# Patient Record
Sex: Male | Born: 1935
Health system: Southern US, Community
[De-identification: ages and names within clinical notes are randomized; demographics above are authoritative.]

## PROBLEM LIST (undated history)

## (undated) DIAGNOSIS — K219 Gastro-esophageal reflux disease without esophagitis: Secondary | ICD-10-CM

## (undated) DIAGNOSIS — F0390 Unspecified dementia without behavioral disturbance: Secondary | ICD-10-CM

## (undated) DIAGNOSIS — R32 Unspecified urinary incontinence: Secondary | ICD-10-CM

## (undated) DIAGNOSIS — C61 Malignant neoplasm of prostate: Secondary | ICD-10-CM

## (undated) DIAGNOSIS — I252 Old myocardial infarction: Secondary | ICD-10-CM

## (undated) HISTORY — PX: CORONARY ARTERY BYPASS GRAFT: SHX141

## (undated) HISTORY — DX: Malignant neoplasm of prostate: C61

## (undated) HISTORY — PX: ABDOMINAL AORTA STENT: SHX1108

## (undated) HISTORY — PX: HERNIA REPAIR: SHX51

## (undated) HISTORY — DX: Gastro-esophageal reflux disease without esophagitis: K21.9

## (undated) HISTORY — PX: PROSTATE SURGERY: SHX751

## (undated) HISTORY — DX: Old myocardial infarction: I25.2

## (undated) HISTORY — DX: Unspecified urinary incontinence: R32

## (undated) HISTORY — DX: Unspecified dementia, unspecified severity, without behavioral disturbance, psychotic disturbance, mood disturbance, and anxiety: F03.90

---

## 2013-11-08 DIAGNOSIS — A46 Erysipelas: Secondary | ICD-10-CM | POA: Diagnosis not present

## 2013-11-08 DIAGNOSIS — J0100 Acute maxillary sinusitis, unspecified: Secondary | ICD-10-CM | POA: Diagnosis not present

## 2013-11-09 DIAGNOSIS — A46 Erysipelas: Secondary | ICD-10-CM | POA: Diagnosis not present

## 2013-11-10 DIAGNOSIS — A46 Erysipelas: Secondary | ICD-10-CM | POA: Diagnosis not present

## 2013-11-23 DIAGNOSIS — E785 Hyperlipidemia, unspecified: Secondary | ICD-10-CM | POA: Diagnosis not present

## 2013-11-23 DIAGNOSIS — I2510 Atherosclerotic heart disease of native coronary artery without angina pectoris: Secondary | ICD-10-CM | POA: Diagnosis not present

## 2014-06-12 DIAGNOSIS — I2510 Atherosclerotic heart disease of native coronary artery without angina pectoris: Secondary | ICD-10-CM | POA: Diagnosis not present

## 2014-06-12 DIAGNOSIS — E785 Hyperlipidemia, unspecified: Secondary | ICD-10-CM | POA: Diagnosis not present

## 2014-06-12 DIAGNOSIS — I10 Essential (primary) hypertension: Secondary | ICD-10-CM | POA: Diagnosis not present

## 2014-06-20 DIAGNOSIS — L219 Seborrheic dermatitis, unspecified: Secondary | ICD-10-CM | POA: Diagnosis not present

## 2014-06-20 DIAGNOSIS — C4441 Basal cell carcinoma of skin of scalp and neck: Secondary | ICD-10-CM | POA: Diagnosis not present

## 2014-07-04 DIAGNOSIS — C4441 Basal cell carcinoma of skin of scalp and neck: Secondary | ICD-10-CM | POA: Diagnosis not present

## 2014-07-18 DIAGNOSIS — L219 Seborrheic dermatitis, unspecified: Secondary | ICD-10-CM | POA: Diagnosis not present

## 2014-07-18 DIAGNOSIS — L570 Actinic keratosis: Secondary | ICD-10-CM | POA: Diagnosis not present

## 2014-07-20 DIAGNOSIS — H2513 Age-related nuclear cataract, bilateral: Secondary | ICD-10-CM | POA: Diagnosis not present

## 2015-04-06 DIAGNOSIS — H1033 Unspecified acute conjunctivitis, bilateral: Secondary | ICD-10-CM | POA: Diagnosis not present

## 2015-04-06 DIAGNOSIS — L247 Irritant contact dermatitis due to plants, except food: Secondary | ICD-10-CM | POA: Diagnosis not present

## 2015-04-26 DIAGNOSIS — I25119 Atherosclerotic heart disease of native coronary artery with unspecified angina pectoris: Secondary | ICD-10-CM

## 2015-04-26 DIAGNOSIS — I713 Abdominal aortic aneurysm, ruptured, unspecified: Secondary | ICD-10-CM | POA: Insufficient documentation

## 2015-04-26 DIAGNOSIS — Z0000 Encounter for general adult medical examination without abnormal findings: Secondary | ICD-10-CM | POA: Diagnosis not present

## 2015-04-26 DIAGNOSIS — I10 Essential (primary) hypertension: Secondary | ICD-10-CM

## 2015-04-26 DIAGNOSIS — E785 Hyperlipidemia, unspecified: Secondary | ICD-10-CM

## 2015-04-26 DIAGNOSIS — I4510 Unspecified right bundle-branch block: Secondary | ICD-10-CM

## 2015-04-26 HISTORY — DX: Essential (primary) hypertension: I10

## 2015-04-26 HISTORY — DX: Unspecified right bundle-branch block: I45.10

## 2015-04-26 HISTORY — DX: Atherosclerotic heart disease of native coronary artery with unspecified angina pectoris: I25.119

## 2015-04-26 HISTORY — DX: Abdominal aortic aneurysm, ruptured: I71.3

## 2015-04-26 HISTORY — DX: Abdominal aortic aneurysm, ruptured, unspecified: I71.30

## 2015-04-26 HISTORY — DX: Hyperlipidemia, unspecified: E78.5

## 2015-04-30 DIAGNOSIS — Z0000 Encounter for general adult medical examination without abnormal findings: Secondary | ICD-10-CM | POA: Diagnosis not present

## 2015-04-30 DIAGNOSIS — I714 Abdominal aortic aneurysm, without rupture: Secondary | ICD-10-CM | POA: Diagnosis not present

## 2015-05-17 DIAGNOSIS — I714 Abdominal aortic aneurysm, without rupture: Secondary | ICD-10-CM | POA: Diagnosis not present

## 2015-05-17 DIAGNOSIS — Z23 Encounter for immunization: Secondary | ICD-10-CM | POA: Diagnosis not present

## 2015-05-24 DIAGNOSIS — I10 Essential (primary) hypertension: Secondary | ICD-10-CM | POA: Diagnosis not present

## 2015-05-24 DIAGNOSIS — I714 Abdominal aortic aneurysm, without rupture: Secondary | ICD-10-CM | POA: Diagnosis not present

## 2015-05-24 DIAGNOSIS — I2510 Atherosclerotic heart disease of native coronary artery without angina pectoris: Secondary | ICD-10-CM | POA: Diagnosis not present

## 2015-05-24 DIAGNOSIS — E785 Hyperlipidemia, unspecified: Secondary | ICD-10-CM | POA: Diagnosis not present

## 2015-05-29 DIAGNOSIS — I25119 Atherosclerotic heart disease of native coronary artery with unspecified angina pectoris: Secondary | ICD-10-CM | POA: Diagnosis not present

## 2015-05-29 DIAGNOSIS — I25118 Atherosclerotic heart disease of native coronary artery with other forms of angina pectoris: Secondary | ICD-10-CM | POA: Diagnosis not present

## 2015-05-31 DIAGNOSIS — I714 Abdominal aortic aneurysm, without rupture: Secondary | ICD-10-CM | POA: Diagnosis not present

## 2015-06-14 DIAGNOSIS — I6523 Occlusion and stenosis of bilateral carotid arteries: Secondary | ICD-10-CM | POA: Diagnosis not present

## 2015-06-14 DIAGNOSIS — I719 Aortic aneurysm of unspecified site, without rupture: Secondary | ICD-10-CM | POA: Diagnosis not present

## 2015-06-14 DIAGNOSIS — Z01810 Encounter for preprocedural cardiovascular examination: Secondary | ICD-10-CM | POA: Diagnosis not present

## 2015-06-21 DIAGNOSIS — I714 Abdominal aortic aneurysm, without rupture: Secondary | ICD-10-CM | POA: Diagnosis not present

## 2015-06-27 DIAGNOSIS — I10 Essential (primary) hypertension: Secondary | ICD-10-CM | POA: Diagnosis present

## 2015-06-27 DIAGNOSIS — I2510 Atherosclerotic heart disease of native coronary artery without angina pectoris: Secondary | ICD-10-CM | POA: Diagnosis present

## 2015-06-27 DIAGNOSIS — G3184 Mild cognitive impairment, so stated: Secondary | ICD-10-CM | POA: Diagnosis present

## 2015-06-27 DIAGNOSIS — E785 Hyperlipidemia, unspecified: Secondary | ICD-10-CM | POA: Diagnosis present

## 2015-06-27 DIAGNOSIS — I714 Abdominal aortic aneurysm, without rupture: Secondary | ICD-10-CM | POA: Diagnosis not present

## 2015-06-27 DIAGNOSIS — R413 Other amnesia: Secondary | ICD-10-CM | POA: Diagnosis present

## 2015-06-27 DIAGNOSIS — Z87891 Personal history of nicotine dependence: Secondary | ICD-10-CM | POA: Diagnosis not present

## 2015-06-27 DIAGNOSIS — K219 Gastro-esophageal reflux disease without esophagitis: Secondary | ICD-10-CM | POA: Diagnosis present

## 2015-07-03 DIAGNOSIS — C44719 Basal cell carcinoma of skin of left lower limb, including hip: Secondary | ICD-10-CM | POA: Diagnosis not present

## 2015-07-03 DIAGNOSIS — L570 Actinic keratosis: Secondary | ICD-10-CM | POA: Diagnosis not present

## 2015-07-03 DIAGNOSIS — D485 Neoplasm of uncertain behavior of skin: Secondary | ICD-10-CM | POA: Diagnosis not present

## 2015-07-20 DIAGNOSIS — C4442 Squamous cell carcinoma of skin of scalp and neck: Secondary | ICD-10-CM | POA: Diagnosis not present

## 2015-08-27 DIAGNOSIS — Z95820 Peripheral vascular angioplasty status with implants and grafts: Secondary | ICD-10-CM | POA: Diagnosis not present

## 2015-08-27 DIAGNOSIS — I719 Aortic aneurysm of unspecified site, without rupture: Secondary | ICD-10-CM | POA: Diagnosis not present

## 2015-08-27 DIAGNOSIS — I714 Abdominal aortic aneurysm, without rupture: Secondary | ICD-10-CM | POA: Diagnosis not present

## 2015-09-03 DIAGNOSIS — C61 Malignant neoplasm of prostate: Secondary | ICD-10-CM | POA: Diagnosis not present

## 2015-09-03 DIAGNOSIS — I2510 Atherosclerotic heart disease of native coronary artery without angina pectoris: Secondary | ICD-10-CM | POA: Diagnosis not present

## 2015-09-03 DIAGNOSIS — I10 Essential (primary) hypertension: Secondary | ICD-10-CM | POA: Diagnosis not present

## 2015-09-03 DIAGNOSIS — R634 Abnormal weight loss: Secondary | ICD-10-CM | POA: Diagnosis not present

## 2015-09-03 DIAGNOSIS — E785 Hyperlipidemia, unspecified: Secondary | ICD-10-CM | POA: Diagnosis not present

## 2015-09-03 DIAGNOSIS — E559 Vitamin D deficiency, unspecified: Secondary | ICD-10-CM | POA: Diagnosis not present

## 2015-09-03 DIAGNOSIS — J449 Chronic obstructive pulmonary disease, unspecified: Secondary | ICD-10-CM | POA: Diagnosis not present

## 2015-09-03 DIAGNOSIS — Z6823 Body mass index (BMI) 23.0-23.9, adult: Secondary | ICD-10-CM | POA: Diagnosis not present

## 2015-09-10 DIAGNOSIS — Z1211 Encounter for screening for malignant neoplasm of colon: Secondary | ICD-10-CM | POA: Diagnosis not present

## 2015-09-10 DIAGNOSIS — R634 Abnormal weight loss: Secondary | ICD-10-CM | POA: Diagnosis not present

## 2015-09-10 DIAGNOSIS — K5909 Other constipation: Secondary | ICD-10-CM | POA: Diagnosis not present

## 2015-09-10 DIAGNOSIS — R32 Unspecified urinary incontinence: Secondary | ICD-10-CM | POA: Diagnosis not present

## 2015-09-12 DIAGNOSIS — R630 Anorexia: Secondary | ICD-10-CM | POA: Diagnosis not present

## 2015-09-12 DIAGNOSIS — R634 Abnormal weight loss: Secondary | ICD-10-CM | POA: Diagnosis not present

## 2015-09-20 DIAGNOSIS — C61 Malignant neoplasm of prostate: Secondary | ICD-10-CM | POA: Diagnosis not present

## 2015-09-20 DIAGNOSIS — R32 Unspecified urinary incontinence: Secondary | ICD-10-CM | POA: Diagnosis not present

## 2015-10-11 DIAGNOSIS — I672 Cerebral atherosclerosis: Secondary | ICD-10-CM | POA: Diagnosis not present

## 2015-10-11 DIAGNOSIS — R413 Other amnesia: Secondary | ICD-10-CM | POA: Diagnosis not present

## 2015-10-11 DIAGNOSIS — F0150 Vascular dementia without behavioral disturbance: Secondary | ICD-10-CM | POA: Diagnosis not present

## 2015-10-18 DIAGNOSIS — I6782 Cerebral ischemia: Secondary | ICD-10-CM | POA: Diagnosis not present

## 2015-10-18 DIAGNOSIS — G319 Degenerative disease of nervous system, unspecified: Secondary | ICD-10-CM | POA: Diagnosis not present

## 2015-10-19 DIAGNOSIS — C61 Malignant neoplasm of prostate: Secondary | ICD-10-CM | POA: Diagnosis not present

## 2015-10-19 DIAGNOSIS — R32 Unspecified urinary incontinence: Secondary | ICD-10-CM | POA: Diagnosis not present

## 2015-10-25 DIAGNOSIS — G301 Alzheimer's disease with late onset: Secondary | ICD-10-CM | POA: Diagnosis not present

## 2015-10-25 DIAGNOSIS — F0280 Dementia in other diseases classified elsewhere without behavioral disturbance: Secondary | ICD-10-CM | POA: Diagnosis not present

## 2015-11-19 DIAGNOSIS — C61 Malignant neoplasm of prostate: Secondary | ICD-10-CM | POA: Diagnosis not present

## 2015-11-19 DIAGNOSIS — R32 Unspecified urinary incontinence: Secondary | ICD-10-CM | POA: Diagnosis not present

## 2015-12-06 DIAGNOSIS — G301 Alzheimer's disease with late onset: Secondary | ICD-10-CM | POA: Diagnosis not present

## 2015-12-06 DIAGNOSIS — F0280 Dementia in other diseases classified elsewhere without behavioral disturbance: Secondary | ICD-10-CM | POA: Diagnosis not present

## 2015-12-19 DIAGNOSIS — I10 Essential (primary) hypertension: Secondary | ICD-10-CM | POA: Diagnosis not present

## 2015-12-19 DIAGNOSIS — E785 Hyperlipidemia, unspecified: Secondary | ICD-10-CM | POA: Diagnosis not present

## 2015-12-19 DIAGNOSIS — I25119 Atherosclerotic heart disease of native coronary artery with unspecified angina pectoris: Secondary | ICD-10-CM | POA: Diagnosis not present

## 2016-01-10 DIAGNOSIS — F0280 Dementia in other diseases classified elsewhere without behavioral disturbance: Secondary | ICD-10-CM | POA: Diagnosis not present

## 2016-01-10 DIAGNOSIS — G301 Alzheimer's disease with late onset: Secondary | ICD-10-CM | POA: Diagnosis not present

## 2016-02-11 DIAGNOSIS — R32 Unspecified urinary incontinence: Secondary | ICD-10-CM | POA: Diagnosis not present

## 2016-02-11 DIAGNOSIS — C61 Malignant neoplasm of prostate: Secondary | ICD-10-CM | POA: Diagnosis not present

## 2016-05-26 DIAGNOSIS — Z23 Encounter for immunization: Secondary | ICD-10-CM | POA: Diagnosis not present

## 2016-05-26 DIAGNOSIS — Z6823 Body mass index (BMI) 23.0-23.9, adult: Secondary | ICD-10-CM | POA: Diagnosis not present

## 2016-05-26 DIAGNOSIS — G309 Alzheimer's disease, unspecified: Secondary | ICD-10-CM | POA: Diagnosis not present

## 2016-05-26 DIAGNOSIS — H1030 Unspecified acute conjunctivitis, unspecified eye: Secondary | ICD-10-CM | POA: Diagnosis not present

## 2016-05-28 DIAGNOSIS — H1045 Other chronic allergic conjunctivitis: Secondary | ICD-10-CM | POA: Diagnosis not present

## 2016-07-10 DIAGNOSIS — G301 Alzheimer's disease with late onset: Secondary | ICD-10-CM | POA: Diagnosis not present

## 2016-07-10 DIAGNOSIS — F0280 Dementia in other diseases classified elsewhere without behavioral disturbance: Secondary | ICD-10-CM | POA: Diagnosis not present

## 2016-07-14 DIAGNOSIS — I10 Essential (primary) hypertension: Secondary | ICD-10-CM | POA: Diagnosis not present

## 2016-07-14 DIAGNOSIS — E785 Hyperlipidemia, unspecified: Secondary | ICD-10-CM | POA: Diagnosis not present

## 2016-07-14 DIAGNOSIS — I6523 Occlusion and stenosis of bilateral carotid arteries: Secondary | ICD-10-CM

## 2016-07-14 DIAGNOSIS — I714 Abdominal aortic aneurysm, without rupture, unspecified: Secondary | ICD-10-CM | POA: Insufficient documentation

## 2016-07-14 DIAGNOSIS — Z95820 Peripheral vascular angioplasty status with implants and grafts: Secondary | ICD-10-CM | POA: Diagnosis not present

## 2016-07-14 HISTORY — DX: Abdominal aortic aneurysm, without rupture, unspecified: I71.40

## 2016-07-14 HISTORY — DX: Occlusion and stenosis of bilateral carotid arteries: I65.23

## 2016-07-14 HISTORY — DX: Abdominal aortic aneurysm, without rupture: I71.4

## 2016-07-15 DIAGNOSIS — I10 Essential (primary) hypertension: Secondary | ICD-10-CM | POA: Diagnosis not present

## 2016-07-15 DIAGNOSIS — E785 Hyperlipidemia, unspecified: Secondary | ICD-10-CM | POA: Diagnosis not present

## 2016-07-15 DIAGNOSIS — I4510 Unspecified right bundle-branch block: Secondary | ICD-10-CM | POA: Diagnosis not present

## 2016-07-15 DIAGNOSIS — I25119 Atherosclerotic heart disease of native coronary artery with unspecified angina pectoris: Secondary | ICD-10-CM | POA: Diagnosis not present

## 2016-07-16 DIAGNOSIS — I25119 Atherosclerotic heart disease of native coronary artery with unspecified angina pectoris: Secondary | ICD-10-CM | POA: Diagnosis not present

## 2016-07-31 DIAGNOSIS — Z6824 Body mass index (BMI) 24.0-24.9, adult: Secondary | ICD-10-CM | POA: Diagnosis not present

## 2016-07-31 DIAGNOSIS — J309 Allergic rhinitis, unspecified: Secondary | ICD-10-CM | POA: Diagnosis not present

## 2016-08-13 DIAGNOSIS — C61 Malignant neoplasm of prostate: Secondary | ICD-10-CM | POA: Diagnosis not present

## 2016-08-13 DIAGNOSIS — R32 Unspecified urinary incontinence: Secondary | ICD-10-CM | POA: Diagnosis not present

## 2016-08-19 DIAGNOSIS — H52223 Regular astigmatism, bilateral: Secondary | ICD-10-CM | POA: Diagnosis not present

## 2016-08-19 DIAGNOSIS — H2513 Age-related nuclear cataract, bilateral: Secondary | ICD-10-CM | POA: Diagnosis not present

## 2016-09-25 DIAGNOSIS — F0280 Dementia in other diseases classified elsewhere without behavioral disturbance: Secondary | ICD-10-CM | POA: Diagnosis not present

## 2016-09-25 DIAGNOSIS — G301 Alzheimer's disease with late onset: Secondary | ICD-10-CM | POA: Diagnosis not present

## 2016-10-22 DIAGNOSIS — I7300 Raynaud's syndrome without gangrene: Secondary | ICD-10-CM | POA: Diagnosis not present

## 2016-10-22 DIAGNOSIS — Z6823 Body mass index (BMI) 23.0-23.9, adult: Secondary | ICD-10-CM | POA: Diagnosis not present

## 2016-11-10 DIAGNOSIS — Z125 Encounter for screening for malignant neoplasm of prostate: Secondary | ICD-10-CM | POA: Diagnosis not present

## 2016-11-10 DIAGNOSIS — Z136 Encounter for screening for cardiovascular disorders: Secondary | ICD-10-CM | POA: Diagnosis not present

## 2016-11-10 DIAGNOSIS — Z23 Encounter for immunization: Secondary | ICD-10-CM | POA: Diagnosis not present

## 2016-11-10 DIAGNOSIS — E785 Hyperlipidemia, unspecified: Secondary | ICD-10-CM | POA: Diagnosis not present

## 2016-11-10 DIAGNOSIS — R630 Anorexia: Secondary | ICD-10-CM | POA: Diagnosis not present

## 2016-11-10 DIAGNOSIS — G309 Alzheimer's disease, unspecified: Secondary | ICD-10-CM | POA: Diagnosis not present

## 2016-11-10 DIAGNOSIS — R32 Unspecified urinary incontinence: Secondary | ICD-10-CM | POA: Diagnosis not present

## 2016-11-10 DIAGNOSIS — Z0000 Encounter for general adult medical examination without abnormal findings: Secondary | ICD-10-CM | POA: Diagnosis not present

## 2016-11-10 DIAGNOSIS — Z1389 Encounter for screening for other disorder: Secondary | ICD-10-CM | POA: Diagnosis not present

## 2016-11-10 DIAGNOSIS — Z9181 History of falling: Secondary | ICD-10-CM | POA: Diagnosis not present

## 2016-12-04 DIAGNOSIS — R197 Diarrhea, unspecified: Secondary | ICD-10-CM | POA: Diagnosis not present

## 2016-12-04 DIAGNOSIS — Z6823 Body mass index (BMI) 23.0-23.9, adult: Secondary | ICD-10-CM | POA: Diagnosis not present

## 2016-12-31 DIAGNOSIS — H60392 Other infective otitis externa, left ear: Secondary | ICD-10-CM | POA: Diagnosis not present

## 2016-12-31 DIAGNOSIS — Z6823 Body mass index (BMI) 23.0-23.9, adult: Secondary | ICD-10-CM | POA: Diagnosis not present

## 2016-12-31 DIAGNOSIS — H1033 Unspecified acute conjunctivitis, bilateral: Secondary | ICD-10-CM | POA: Diagnosis not present

## 2017-01-01 DIAGNOSIS — R32 Unspecified urinary incontinence: Secondary | ICD-10-CM | POA: Diagnosis not present

## 2017-01-01 DIAGNOSIS — Z8546 Personal history of malignant neoplasm of prostate: Secondary | ICD-10-CM | POA: Diagnosis not present

## 2017-01-08 DIAGNOSIS — F0280 Dementia in other diseases classified elsewhere without behavioral disturbance: Secondary | ICD-10-CM | POA: Diagnosis not present

## 2017-01-08 DIAGNOSIS — G301 Alzheimer's disease with late onset: Secondary | ICD-10-CM | POA: Diagnosis not present

## 2017-02-05 ENCOUNTER — Encounter: Payer: Self-pay | Admitting: Cardiology

## 2017-02-06 ENCOUNTER — Ambulatory Visit (INDEPENDENT_AMBULATORY_CARE_PROVIDER_SITE_OTHER): Payer: Medicare Other | Admitting: Cardiology

## 2017-02-06 ENCOUNTER — Encounter: Payer: Self-pay | Admitting: Cardiology

## 2017-02-06 VITALS — BP 138/80 | HR 55 | Ht 68.0 in | Wt 157.1 lb

## 2017-02-06 DIAGNOSIS — I4510 Unspecified right bundle-branch block: Secondary | ICD-10-CM

## 2017-02-06 DIAGNOSIS — I10 Essential (primary) hypertension: Secondary | ICD-10-CM | POA: Diagnosis not present

## 2017-02-06 DIAGNOSIS — I25119 Atherosclerotic heart disease of native coronary artery with unspecified angina pectoris: Secondary | ICD-10-CM | POA: Diagnosis not present

## 2017-02-06 NOTE — Progress Notes (Signed)
Cardiology Office Note:    Date:  02/06/2017   ID:  Justin Lester, DOB 1930/12/27, MRN [id number]  PCP:  [name], MD  Cardiologist:  Shirlee More, MD    Referring MD: [name], MD    ASSESSMENT:    1. Coronary artery disease involving native coronary artery of native heart with angina pectoris ([place])   2. Essential hypertension   3. RBBB    PLAN:    In order of problems listed above:  1. They will continue medical treatment including beta blocker oral nitrate and aspirin high intensity statin at this time I do not feel he needs an ischemia evaluation. Lipids requested PCP goal LDL less than 70. 2. Stable continue current treatment including ACE inhibitor 3. Stable pattern on EKG 4. Stable dyslipidemia continue statin  Next appointment: 6 months   Medication Adjustments/Labs and Tests Ordered: Current medicines are reviewed at length with the patient today.  Concerns regarding medicines are outlined above.  No orders of the defined types were placed in this encounter.  No orders of the defined types were placed in this encounter.   Chief Complaint  Patient presents with  . Follow-up    for CAD    History of Present Illness:    Justin Lester is a 83 y.o. male with a hx of CAD, S/P CABG, CHF, Dyslipidemia, HTN, Peripheral Vascular Disease, with EVAR of AAA   last seen Within the last 6 months. Compliance with diet, lifestyle and medications: Yes As always pleasure to see Justin Lester is compliant with medications exercise diet enjoys his life and is not having angina palpitations syncope shortness of breath and exercise intolerance or TIA Past Medical History:  Diagnosis Date  . Dementia   . Esophageal reflux   . Old myocardial infarct   . Prostate cancer [facility])     Past Surgical History:  Procedure Laterality Date  . ABDOMINAL AORTA STENT    . CORONARY ARTERY BYPASS GRAFT    . HERNIA REPAIR    . PROSTATE SURGERY      Current Medications: Current  Meds  Medication Sig  . Acetylcarnitine HCl (ACETYL L-CARNITINE) 500 MG CAPS Take 500 mg by mouth daily.  [name] aspirin EC 81 MG tablet Take 81 mg by mouth daily.  [name] atorvastatin (LIPITOR) 10 MG tablet Take 10 mg by mouth daily.  . Cholecalciferol (VITAMIN D PO) Take 2,000 Units by mouth daily.  [name] Co-Enzyme Q-10 30 MG CAPS Take 10 mg by mouth daily.  [name] imipramine (TOFRANIL) 10 MG tablet Take 10 mg by mouth 2 (two) times daily.  . isosorbide mononitrate (IMDUR) 60 MG 24 hr tablet Take 60 mg by mouth daily.  . magnesium oxide (MAG-OX) 400 MG tablet Take 400 mg by mouth daily.  . Memantine HCl-Donepezil HCl (NAMZARIC) 28-10 MG CP24 Take 1 capsule by mouth daily.  . metoprolol succinate (TOPROL-XL) 50 MG 24 hr tablet Take 50 mg by mouth daily.  . Multiple Vitamin (MULTI-VITAMINS) TABS Take 1 tablet by mouth daily.  . ramipril (ALTACE) 2.5 MG capsule Take 2.5 mg by mouth daily.     Allergies:   Patient has no known allergies.   Social History   Social History  . Marital status: Married    Spouse name: N/A  . Number of children: N/A  . Years of education: N/A   Social History Main Topics  . Smoking status: Former Research scientist (life sciences)  . Smokeless tobacco: Never Used  . Alcohol use No  .  Drug use: No  . Sexual activity: Not Asked   Other Topics Concern  . None   Social History Narrative  . None     Family History: The patient's family history includes CAD in his brother, mother, and sister. ROS:   Please see the history of present illness.    All other systems reviewed and are negative.  EKGs/Labs/Other Studies Reviewed:    The following studies were reviewed today:  EKG:  EKG ordered today.  The ekg ordered today demonstrates A stable pattern of right bundle branch block sinus rhythm  Recent Labs:Requested from his PCP   Physical Exam:    VS:  BP 138/80   Pulse (!) 55   Ht 5\' 8"  (1.727 m)   Wt 157 lb 1.9 oz (71.3 kg)   SpO2 98%   BMI 23.89 kg/m     Wt Readings from Last 3  Encounters:  02/06/17 157 lb 1.9 oz (71.3 kg)     GEN:  Well nourished, well developed in no acute distress HEENT: Normal NECK: No JVD; No carotid bruits LYMPHATICS: No lymphadenopathy CARDIAC: RRR, no murmurs, rubs, gallops RESPIRATORY:  Clear to auscultation without rales, wheezing or rhonchi  ABDOMEN: Soft, non-tender, non-distended MUSCULOSKELETAL:  No edema; No deformity  SKIN: Warm and dry NEUROLOGIC:  Alert and oriented x 3 PSYCHIATRIC:  Normal affect    Signed, Shirlee More, MD  02/06/2017 11:27 AM    [facility]

## 2017-02-06 NOTE — Patient Instructions (Signed)
Medication Instructions:  Your physician recommends that you continue on your current medications as directed. Please refer to the Current Medication list given to you today.   Labwork: Your physician recommends that you return for lab work in: today. Lipids, CMP.    Testing/Procedures: You had an EKG done in the office today.  Follow-Up: Your physician wants you to follow-up in: 6 months. You will receive a reminder letter in the mail two months in advance. If you don't receive a letter, please call our office to schedule the follow-up appointment.   Any Other Special Instructions Will Be Listed Below (If Applicable).     If you need a refill on your cardiac medications before your next appointment, please call your pharmacy.

## 2017-02-07 LAB — COMPREHENSIVE METABOLIC PANEL
A/G RATIO: 2.2 (ref 1.2–2.2)
ALBUMIN: 4.7 g/dL (ref 3.5–4.7)
ALT: 20 IU/L (ref 0–44)
AST: 26 IU/L (ref 0–40)
Alkaline Phosphatase: 82 IU/L (ref 39–117)
BILIRUBIN TOTAL: 0.7 mg/dL (ref 0.0–1.2)
BUN / CREAT RATIO: 12 (ref 10–24)
BUN: 13 mg/dL (ref 8–27)
CHLORIDE: 99 mmol/L (ref 96–106)
CO2: 26 mmol/L (ref 20–29)
Calcium: 9.5 mg/dL (ref 8.6–10.2)
Creatinine, Ser: 1.05 mg/dL (ref 0.76–1.27)
GFR calc Af Amer: 76 mL/min/{1.73_m2} (ref >59)
GFR calc non Af Amer: 65 mL/min/{1.73_m2} (ref >59)
GLOBULIN, TOTAL: 2.1 g/dL (ref 1.5–4.5)
Glucose: 101 mg/dL — ABNORMAL HIGH (ref 65–99)
POTASSIUM: 4.5 mmol/L (ref 3.5–5.2)
SODIUM: 139 mmol/L (ref 134–144)
Total Protein: 6.8 g/dL (ref 6.0–8.5)

## 2017-02-07 LAB — LIPID PANEL W/O CHOL/HDL RATIO
CHOLESTEROL TOTAL: 164 mg/dL (ref 100–199)
HDL: 43 mg/dL (ref >39)
LDL CALC: 96 mg/dL (ref 0–99)
TRIGLYCERIDES: 123 mg/dL (ref 0–149)
VLDL Cholesterol Cal: 25 mg/dL (ref 5–40)

## 2017-02-10 DIAGNOSIS — C61 Malignant neoplasm of prostate: Secondary | ICD-10-CM | POA: Diagnosis not present

## 2017-02-10 DIAGNOSIS — R32 Unspecified urinary incontinence: Secondary | ICD-10-CM | POA: Diagnosis not present

## 2017-02-16 DIAGNOSIS — R350 Frequency of micturition: Secondary | ICD-10-CM | POA: Diagnosis not present

## 2017-02-16 DIAGNOSIS — N3942 Incontinence without sensory awareness: Secondary | ICD-10-CM | POA: Diagnosis not present

## 2017-03-09 DIAGNOSIS — Z6823 Body mass index (BMI) 23.0-23.9, adult: Secondary | ICD-10-CM | POA: Diagnosis not present

## 2017-03-09 DIAGNOSIS — L03116 Cellulitis of left lower limb: Secondary | ICD-10-CM | POA: Diagnosis not present

## 2017-03-13 DIAGNOSIS — M79672 Pain in left foot: Secondary | ICD-10-CM | POA: Diagnosis not present

## 2017-03-13 DIAGNOSIS — M25572 Pain in left ankle and joints of left foot: Secondary | ICD-10-CM | POA: Diagnosis not present

## 2017-03-13 DIAGNOSIS — R21 Rash and other nonspecific skin eruption: Secondary | ICD-10-CM | POA: Diagnosis not present

## 2017-03-13 DIAGNOSIS — L03116 Cellulitis of left lower limb: Secondary | ICD-10-CM | POA: Diagnosis not present

## 2017-03-16 DIAGNOSIS — T148XXA Other injury of unspecified body region, initial encounter: Secondary | ICD-10-CM | POA: Diagnosis not present

## 2017-03-16 DIAGNOSIS — L821 Other seborrheic keratosis: Secondary | ICD-10-CM | POA: Diagnosis not present

## 2017-03-17 DIAGNOSIS — N3942 Incontinence without sensory awareness: Secondary | ICD-10-CM | POA: Diagnosis not present

## 2017-03-17 DIAGNOSIS — R350 Frequency of micturition: Secondary | ICD-10-CM | POA: Diagnosis not present

## 2017-03-25 DIAGNOSIS — R350 Frequency of micturition: Secondary | ICD-10-CM | POA: Diagnosis not present

## 2017-03-25 DIAGNOSIS — R32 Unspecified urinary incontinence: Secondary | ICD-10-CM | POA: Diagnosis not present

## 2017-05-14 DIAGNOSIS — J309 Allergic rhinitis, unspecified: Secondary | ICD-10-CM | POA: Diagnosis not present

## 2017-06-05 ENCOUNTER — Other Ambulatory Visit: Payer: Self-pay | Admitting: *Deleted

## 2017-06-05 MED ORDER — METOPROLOL SUCCINATE ER 50 MG PO TB24: 50.0000 mg | ORAL_TABLET | Freq: Every day | ORAL | 2 refills | Status: DC

## 2017-06-12 DIAGNOSIS — R32 Unspecified urinary incontinence: Secondary | ICD-10-CM | POA: Diagnosis not present

## 2017-06-12 DIAGNOSIS — R350 Frequency of micturition: Secondary | ICD-10-CM | POA: Diagnosis not present

## 2017-07-10 DIAGNOSIS — G301 Alzheimer's disease with late onset: Secondary | ICD-10-CM | POA: Diagnosis not present

## 2017-07-10 DIAGNOSIS — F0280 Dementia in other diseases classified elsewhere without behavioral disturbance: Secondary | ICD-10-CM | POA: Diagnosis not present

## 2017-07-20 ENCOUNTER — Telehealth: Payer: Self-pay | Admitting: Cardiology

## 2017-07-20 MED ORDER — ATORVASTATIN CALCIUM 10 MG PO TABS: 10.0000 mg | ORAL_TABLET | Freq: Every day | ORAL | 2 refills | Status: DC

## 2017-07-20 NOTE — Telephone Encounter (Signed)
Refill sent.

## 2017-07-20 NOTE — Telephone Encounter (Signed)
Mrs. Malerba states [place] needs a refill of Atoreastatin 10mg  called to [facility] please.

## 2017-07-31 DIAGNOSIS — I6523 Occlusion and stenosis of bilateral carotid arteries: Secondary | ICD-10-CM | POA: Diagnosis not present

## 2017-07-31 DIAGNOSIS — I714 Abdominal aortic aneurysm, without rupture: Secondary | ICD-10-CM | POA: Diagnosis not present

## 2017-08-18 DIAGNOSIS — R32 Unspecified urinary incontinence: Secondary | ICD-10-CM | POA: Diagnosis not present

## 2017-08-18 DIAGNOSIS — C61 Malignant neoplasm of prostate: Secondary | ICD-10-CM | POA: Diagnosis not present

## 2017-08-18 DIAGNOSIS — R21 Rash and other nonspecific skin eruption: Secondary | ICD-10-CM | POA: Diagnosis not present

## 2017-08-20 DIAGNOSIS — N3942 Incontinence without sensory awareness: Secondary | ICD-10-CM | POA: Diagnosis not present

## 2017-08-20 DIAGNOSIS — R350 Frequency of micturition: Secondary | ICD-10-CM | POA: Diagnosis not present

## 2017-09-03 DIAGNOSIS — Z7982 Long term (current) use of aspirin: Secondary | ICD-10-CM | POA: Diagnosis not present

## 2017-09-03 DIAGNOSIS — I714 Abdominal aortic aneurysm, without rupture: Secondary | ICD-10-CM | POA: Diagnosis not present

## 2017-09-03 DIAGNOSIS — Z95828 Presence of other vascular implants and grafts: Secondary | ICD-10-CM | POA: Diagnosis not present

## 2017-09-04 NOTE — Progress Notes (Signed)
Cardiology Office Note:    Date:  09/07/2017   ID:  Justin Lester, DOB June 12, 1931, MRN [id number]  PCP:  [name], MD  Cardiologist:  Shirlee More, MD    Referring MD: [name], MD    ASSESSMENT:    1. Coronary artery disease involving native coronary artery of native heart with angina pectoris ([place])   2. Essential hypertension   3. RBBB   4. Hyperlipidemia, unspecified hyperlipidemia type   5. Bilateral carotid artery stenosis    PLAN:    In order of problems listed above:  1. Stable continue medical therapy aspirin beta-blocker high intensity statin at this time I do not think he requires an ischemia evaluation 2. Stable continue current treatment including calcium channel blocker 3. Stable pattern on EKG 4. Stable continue statin await labs from his PCP for liver function toxicity LDL cholesterol efficacy 5. Stable presently managed by vascular surgery   Next appointment: 6 months   Medication Adjustments/Labs and Tests Ordered: Current medicines are reviewed at length with the patient today.  Concerns regarding medicines are outlined above.  Orders Placed This Encounter  Procedures  . EKG 12-Lead   No orders of the defined types were placed in this encounter.   Chief Complaint  Patient presents with  . Follow-up    6 month flup appt   . Coronary Artery Disease  . Hyperlipidemia  . Hypertension    History of Present Illness:    Justin Lester is a 84 y.o. male with a hx of CAD, S/P CABG, CHF, Dyslipidemia, HTN, Peripheral Vascular Disease, with EVAR of AAA  last seen 6 months ago. Compliance with diet, lifestyle and medications: Yes He is now taking a calcium channel blocker for Raynaud's and his hypertension.  Recent labs requested from his PCP.  He is following with vascular surgery [facility] and is having surveillance of his EVA R and carotid stenoses.  I cannot see these results in care everywhere.  He is pleased with the quality of his  life he has had no cardiovascular symptoms of chest pain shortness of breath palpitation or syncope or side effects from his medical treatment.  With bifascicular heart block I will repeat his EKG today looking for progression. Past Medical History:  Diagnosis Date  . Dementia   . Esophageal reflux   . Old myocardial infarct   . Prostate cancer [facility])     Past Surgical History:  Procedure Laterality Date  . ABDOMINAL AORTA STENT    . CORONARY ARTERY BYPASS GRAFT    . HERNIA REPAIR    . PROSTATE SURGERY      Current Medications: Current Meds  Medication Sig  . aspirin EC 81 MG tablet Take 81 mg by mouth every other day.   [name] atorvastatin (LIPITOR) 10 MG tablet Take 1 tablet (10 mg total) by mouth daily.  . Cholecalciferol (VITAMIN D PO) Take 2,000 Units by mouth daily.  [name] Co-Enzyme Q-10 30 MG CAPS Take 10 mg by mouth daily.  [name] imipramine (TOFRANIL) 10 MG tablet Take 10 mg by mouth 2 (two) times daily.  . magnesium oxide (MAG-OX) 400 MG tablet Take 400 mg by mouth daily.  . Memantine HCl-Donepezil HCl (NAMZARIC) 28-10 MG CP24 Take 1 capsule by mouth daily.  . metoprolol succinate (TOPROL-XL) 50 MG 24 hr tablet Take 1 tablet (50 mg total) by mouth daily.  . Multiple Vitamin (MULTI-VITAMINS) TABS Take 1 tablet by mouth daily.  [name] NIFEdipine (PROCARDIA-XL/ADALAT-CC/NIFEDICAL-XL) 30 MG 24  hr tablet TK 1 T PO D  . ramipril (ALTACE) 2.5 MG capsule Take 2.5 mg by mouth daily.  . vitamin B-12 (CYANOCOBALAMIN) 1000 MCG tablet Take 1 tablet by mouth daily.     Allergies:   Patient has no known allergies.   Social History   Socioeconomic History  . Marital status: Married    Spouse name: None  . Number of children: None  . Years of education: None  . Highest education level: None  Social Needs  . Financial resource strain: None  . Food insecurity - worry: None  . Food insecurity - inability: None  . Transportation needs - medical: None  . Transportation needs - non-medical: None    Occupational History  . None  Tobacco Use  . Smoking status: Former Research scientist (life sciences)  . Smokeless tobacco: Never Used  Substance and Sexual Activity  . Alcohol use: No  . Drug use: No  . Sexual activity: None  Other Topics Concern  . None  Social History Narrative  . None     Family History: The patient's family history includes CAD in his brother, mother, and sister. ROS:   Please see the history of present illness.    All other systems reviewed and are negative.  EKGs/Labs/Other Studies Reviewed:    The following studies were reviewed today:  EKG:  EKG ordered today.  The ekg ordered today demonstrates Roberts RBBB  Recent Labs: 02/06/2017: ALT 20; BUN 13; Creatinine, Ser 1.05; Potassium 4.5; Sodium 139  Recent Lipid Panel    Component Value Date/Time   CHOL 164 02/06/2017 1205   TRIG 123 02/06/2017 1205   HDL 43 02/06/2017 1205   LDLCALC 96 02/06/2017 1205    Physical Exam:    VS:  BP 138/82 (BP Location: Right Arm, Patient Position: Sitting, Cuff Size: Normal)   Pulse (!) 59   Ht 5\' 8"  (1.727 m)   Wt 163 lb (73.9 kg)   SpO2 98%   BMI 24.78 kg/m     Wt Readings from Last 3 Encounters:  09/07/17 163 lb (73.9 kg)  02/06/17 157 lb 1.9 oz (71.3 kg)     GEN:  Well nourished, well developed in no acute distress HEENT: Normal NECK: No JVD; No carotid bruits LYMPHATICS: No lymphadenopathy CARDIAC: RRR, no murmurs, rubs, gallops RESPIRATORY:  Clear to auscultation without rales, wheezing or rhonchi  ABDOMEN: Soft, non-tender, non-distended MUSCULOSKELETAL:  No edema; No deformity  SKIN: Warm and dry NEUROLOGIC:  Alert and oriented x 3 PSYCHIATRIC:  Normal affect    Signed, Shirlee More, MD  09/07/2017 10:06 AM    [place]

## 2017-09-07 ENCOUNTER — Encounter: Payer: Self-pay | Admitting: Cardiology

## 2017-09-07 ENCOUNTER — Ambulatory Visit (INDEPENDENT_AMBULATORY_CARE_PROVIDER_SITE_OTHER): Payer: Medicare Other | Admitting: Cardiology

## 2017-09-07 VITALS — BP 138/82 | HR 59 | Ht 68.0 in | Wt 163.0 lb

## 2017-09-07 DIAGNOSIS — I25119 Atherosclerotic heart disease of native coronary artery with unspecified angina pectoris: Secondary | ICD-10-CM | POA: Diagnosis not present

## 2017-09-07 DIAGNOSIS — E785 Hyperlipidemia, unspecified: Secondary | ICD-10-CM | POA: Diagnosis not present

## 2017-09-07 DIAGNOSIS — I6523 Occlusion and stenosis of bilateral carotid arteries: Secondary | ICD-10-CM | POA: Diagnosis not present

## 2017-09-07 DIAGNOSIS — I10 Essential (primary) hypertension: Secondary | ICD-10-CM

## 2017-09-07 DIAGNOSIS — I4510 Unspecified right bundle-branch block: Secondary | ICD-10-CM | POA: Diagnosis not present

## 2017-09-07 NOTE — Patient Instructions (Signed)

## 2017-11-12 DIAGNOSIS — Z9181 History of falling: Secondary | ICD-10-CM | POA: Diagnosis not present

## 2017-11-12 DIAGNOSIS — Z1331 Encounter for screening for depression: Secondary | ICD-10-CM | POA: Diagnosis not present

## 2017-11-12 DIAGNOSIS — Z125 Encounter for screening for malignant neoplasm of prostate: Secondary | ICD-10-CM | POA: Diagnosis not present

## 2017-11-12 DIAGNOSIS — Z136 Encounter for screening for cardiovascular disorders: Secondary | ICD-10-CM | POA: Diagnosis not present

## 2017-11-12 DIAGNOSIS — E785 Hyperlipidemia, unspecified: Secondary | ICD-10-CM | POA: Diagnosis not present

## 2017-11-12 DIAGNOSIS — Z0000 Encounter for general adult medical examination without abnormal findings: Secondary | ICD-10-CM | POA: Diagnosis not present

## 2017-12-02 ENCOUNTER — Telehealth: Payer: Self-pay | Admitting: Cardiology

## 2017-12-02 MED ORDER — RAMIPRIL 2.5 MG PO CAPS
2.5000 mg | ORAL_CAPSULE | Freq: Every day | ORAL | 2 refills | Status: DC
Start: 2017-12-02 — End: 2018-08-24

## 2017-12-02 NOTE — Telephone Encounter (Signed)
Refill sent.

## 2017-12-02 NOTE — Telephone Encounter (Signed)
Please refill ramipril asap (out) at [facility] on [address]

## 2018-01-08 DIAGNOSIS — F0280 Dementia in other diseases classified elsewhere without behavioral disturbance: Secondary | ICD-10-CM | POA: Diagnosis not present

## 2018-01-08 DIAGNOSIS — G301 Alzheimer's disease with late onset: Secondary | ICD-10-CM | POA: Diagnosis not present

## 2018-02-15 DIAGNOSIS — C61 Malignant neoplasm of prostate: Secondary | ICD-10-CM | POA: Diagnosis not present

## 2018-02-15 DIAGNOSIS — R32 Unspecified urinary incontinence: Secondary | ICD-10-CM | POA: Diagnosis not present

## 2018-03-01 ENCOUNTER — Other Ambulatory Visit: Payer: Self-pay | Admitting: *Deleted

## 2018-03-01 MED ORDER — METOPROLOL SUCCINATE ER 50 MG PO TB24: 50.0000 mg | ORAL_TABLET | Freq: Every day | ORAL | 1 refills | Status: DC

## 2018-03-01 NOTE — Telephone Encounter (Signed)
Refill for metoprolol succinate sent to [facility] in [place].

## 2018-03-24 DIAGNOSIS — R300 Dysuria: Secondary | ICD-10-CM | POA: Diagnosis not present

## 2018-03-24 DIAGNOSIS — L237 Allergic contact dermatitis due to plants, except food: Secondary | ICD-10-CM | POA: Diagnosis not present

## 2018-04-09 ENCOUNTER — Telehealth: Payer: Self-pay

## 2018-04-09 MED ORDER — ATORVASTATIN CALCIUM 10 MG PO TABS: 10.0000 mg | ORAL_TABLET | Freq: Every day | ORAL | 2 refills | Status: DC

## 2018-04-09 NOTE — Telephone Encounter (Signed)
Rx sent to pharmacy as requested.

## 2018-04-26 NOTE — Progress Notes (Deleted)
Cardiology Office Note:    Date:  04/26/2018   ID:  Justin Lester, DOB 02-23-1931, MRN [id number]  PCP:  [name], MD  Cardiologist:  Shirlee More, MD    Referring MD: [name], MD    ASSESSMENT:    No diagnosis found. PLAN:    In order of problems listed above:  1. ***   Next appointment: ***   Medication Adjustments/Labs and Tests Ordered: Current medicines are reviewed at length with the patient today.  Concerns regarding medicines are outlined above.  No orders of the defined types were placed in this encounter.  No orders of the defined types were placed in this encounter.   No chief complaint on file.   History of Present Illness:    Justin Lester is a 84 y.o. male with a hx of CAD, S/P CABG, CHF, Dyslipidemia, HTN, Peripheral Vascular Disease, with EVAR of AAA  last seen 09/07/17. Compliance with diet, lifestyle and medications: *** Past Medical History:  Diagnosis Date  . Dementia   . Esophageal reflux   . Old myocardial infarct   . Prostate cancer [facility])     Past Surgical History:  Procedure Laterality Date  . ABDOMINAL AORTA STENT    . CORONARY ARTERY BYPASS GRAFT    . HERNIA REPAIR    . PROSTATE SURGERY      Current Medications: No outpatient medications have been marked as taking for the 04/27/18 encounter (Appointment) with Richardo Priest, MD.     Allergies:   Patient has no known allergies.   Social History   Socioeconomic History  . Marital status: Married    Spouse name: Not on file  . Number of children: Not on file  . Years of education: Not on file  . Highest education level: Not on file  Occupational History  . Not on file  Social Needs  . Financial resource strain: Not on file  . Food insecurity:    Worry: Not on file    Inability: Not on file  . Transportation needs:    Medical: Not on file    Non-medical: Not on file  Tobacco Use  . Smoking status: Former Research scientist (life sciences)  . Smokeless tobacco: Never Used    Substance and Sexual Activity  . Alcohol use: No  . Drug use: No  . Sexual activity: Not on file  Lifestyle  . Physical activity:    Days per week: Not on file    Minutes per session: Not on file  . Stress: Not on file  Relationships  . Social connections:    Talks on phone: Not on file    Gets together: Not on file    Attends religious service: Not on file    Active member of club or organization: Not on file    Attends meetings of clubs or organizations: Not on file    Relationship status: Not on file  Other Topics Concern  . Not on file  Social History Narrative  . Not on file     Family History: The patient's ***family history includes CAD in his brother, mother, and sister. ROS:   Please see the history of present illness.    All other systems reviewed and are negative.  EKGs/Labs/Other Studies Reviewed:    The following studies were reviewed today:  EKG:  EKG ordered today.  The ekg ordered today demonstrates ***  Recent Labs: No results found for requested labs within last 8760 hours.  Recent Lipid Panel  Component Value Date/Time   CHOL 164 02/06/2017 1205   TRIG 123 02/06/2017 1205   HDL 43 02/06/2017 1205   LDLCALC 96 02/06/2017 1205    Physical Exam:    VS:  There were no vitals taken for this visit.    Wt Readings from Last 3 Encounters:  09/07/17 163 lb (73.9 kg)  02/06/17 157 lb 1.9 oz (71.3 kg)     GEN: *** Well nourished, well developed in no acute distress HEENT: Normal NECK: No JVD; No carotid bruits LYMPHATICS: No lymphadenopathy CARDIAC: ***RRR, no murmurs, rubs, gallops RESPIRATORY:  Clear to auscultation without rales, wheezing or rhonchi  ABDOMEN: Soft, non-tender, non-distended MUSCULOSKELETAL:  No edema; No deformity  SKIN: Warm and dry NEUROLOGIC:  Alert and oriented x 3 PSYCHIATRIC:  Normal affect    Signed, Shirlee More, MD  04/26/2018 1:21 PM    [facility]

## 2018-04-27 ENCOUNTER — Ambulatory Visit: Payer: Medicare Other | Admitting: Cardiology

## 2018-05-12 DIAGNOSIS — G301 Alzheimer's disease with late onset: Secondary | ICD-10-CM | POA: Diagnosis not present

## 2018-05-12 DIAGNOSIS — F0280 Dementia in other diseases classified elsewhere without behavioral disturbance: Secondary | ICD-10-CM | POA: Diagnosis not present

## 2018-05-17 DIAGNOSIS — R21 Rash and other nonspecific skin eruption: Secondary | ICD-10-CM | POA: Diagnosis not present

## 2018-05-17 DIAGNOSIS — R32 Unspecified urinary incontinence: Secondary | ICD-10-CM | POA: Diagnosis not present

## 2018-05-17 DIAGNOSIS — C61 Malignant neoplasm of prostate: Secondary | ICD-10-CM | POA: Diagnosis not present

## 2018-05-19 DIAGNOSIS — N390 Urinary tract infection, site not specified: Secondary | ICD-10-CM | POA: Diagnosis not present

## 2018-05-19 DIAGNOSIS — C61 Malignant neoplasm of prostate: Secondary | ICD-10-CM | POA: Diagnosis not present

## 2018-05-19 DIAGNOSIS — R32 Unspecified urinary incontinence: Secondary | ICD-10-CM | POA: Diagnosis not present

## 2018-05-25 ENCOUNTER — Ambulatory Visit: Payer: Medicare Other | Admitting: Cardiology

## 2018-05-28 DIAGNOSIS — R301 Vesical tenesmus: Secondary | ICD-10-CM | POA: Diagnosis not present

## 2018-05-28 DIAGNOSIS — L22 Diaper dermatitis: Secondary | ICD-10-CM | POA: Diagnosis not present

## 2018-05-28 DIAGNOSIS — R32 Unspecified urinary incontinence: Secondary | ICD-10-CM | POA: Diagnosis not present

## 2018-05-28 DIAGNOSIS — B372 Candidiasis of skin and nail: Secondary | ICD-10-CM | POA: Diagnosis not present

## 2018-05-28 DIAGNOSIS — Z8546 Personal history of malignant neoplasm of prostate: Secondary | ICD-10-CM | POA: Diagnosis not present

## 2018-05-28 DIAGNOSIS — Z9079 Acquired absence of other genital organ(s): Secondary | ICD-10-CM | POA: Diagnosis not present

## 2018-05-31 DIAGNOSIS — R32 Unspecified urinary incontinence: Secondary | ICD-10-CM | POA: Diagnosis not present

## 2018-05-31 DIAGNOSIS — R301 Vesical tenesmus: Secondary | ICD-10-CM | POA: Diagnosis not present

## 2018-06-30 ENCOUNTER — Ambulatory Visit (INDEPENDENT_AMBULATORY_CARE_PROVIDER_SITE_OTHER): Payer: Medicare Other | Admitting: Cardiology

## 2018-06-30 VITALS — BP 126/80 | HR 66 | Ht 68.0 in | Wt 157.6 lb

## 2018-06-30 DIAGNOSIS — I10 Essential (primary) hypertension: Secondary | ICD-10-CM | POA: Diagnosis not present

## 2018-06-30 DIAGNOSIS — I25119 Atherosclerotic heart disease of native coronary artery with unspecified angina pectoris: Secondary | ICD-10-CM | POA: Diagnosis not present

## 2018-06-30 DIAGNOSIS — E785 Hyperlipidemia, unspecified: Secondary | ICD-10-CM | POA: Diagnosis not present

## 2018-06-30 NOTE — Patient Instructions (Signed)
Medication Instructions:  Your physician recommends that you continue on your current medications as directed. Please refer to the Current Medication list given to you today.  If you need a refill on your cardiac medications before your next appointment, please call your pharmacy.   Lab work: Your physician recommends that you return for lab work today: CMP, lipid panel.   If you have labs (blood work) drawn today and your tests are completely normal, you will receive your results only by: [name] MyChart Message (if you have MyChart) OR . A paper copy in the mail If you have any lab test that is abnormal or we need to change your treatment, we will call you to review the results.  Testing/Procedures: You had an EKG today.   Follow-Up: At [facility], you and your health needs are our priority.  As part of our continuing mission to provide you with exceptional heart care, we have created designated Provider Care Teams.  These Care Teams include your primary Cardiologist (physician) and Advanced Practice Providers (APPs -  Physician Assistants and Nurse Practitioners) who all work together to provide you with the care you need, when you need it. You will need a follow up appointment in 6 months.  Please call our office 2 months in advance to schedule this appointment.

## 2018-06-30 NOTE — Progress Notes (Signed)
Cardiology Office Note:    Date:  06/30/2018   ID:  Justin Lester, DOB 05-Mar-1931, MRN [id number]  PCP:  [name], MD  Cardiologist:  Shirlee More, MD    Referring MD: [name], MD    ASSESSMENT:    1. Coronary artery disease involving native coronary artery of native heart with angina pectoris ([place])   2. Essential hypertension   3. Hyperlipidemia, unspecified hyperlipidemia type    PLAN:    In order of problems listed above:  1. Torris is stable CAD New York Heart Association class I following remote revascularization bypass surgery.  At this time we will continue medical treatment with aspirin beta-blocker and high intensity statin and after discussion we do not feel the necessity for an ischemia evaluation. 2. Stable blood pressure target continue treatment beta-blocker and ACE inhibitor.  Will check renal function potassium today 3. Stable continue his statin has been 1/2 years check lipid profile liver function for efficacy and safety. 4. Dementia stable continue current treatment    Next appointment: 6 months   Medication Adjustments/Labs and Tests Ordered: Current medicines are reviewed at length with the patient today.  Concerns regarding medicines are outlined above.  Orders Placed This Encounter  Procedures  . Lipid Profile  . Comp Met (CMET)  . EKG 12-Lead   No orders of the defined types were placed in this encounter.   No chief complaint on file.   History of Present Illness:    Justin Lester is a 84 y.o. male with a hx of  CAD, S/P CABG, CHF, Dyslipidemia, HTN, Peripheral Vascular Disease, with EVAR of AAA   last seen 92/04/19. 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  His EKG shows a stable pattern right bundle branch block we will  check labs today for renal function potassium liver function lipid profile EKG shows stable right bundle branch block and at this time I do not think he requires diagnostic cardiac testing Past Medical History:  Diagnosis Date  . Dementia   . Esophageal reflux   . Old myocardial infarct   . Prostate cancer [facility])     Past Surgical History:  Procedure Laterality Date  . ABDOMINAL AORTA STENT    . CORONARY ARTERY BYPASS GRAFT    . HERNIA REPAIR    . PROSTATE SURGERY      Current Medications: Current Meds  Medication Sig  . aspirin EC 81 MG tablet Take 81 mg by mouth every other day.   [name] atorvastatin (LIPITOR) 10 MG tablet Take 1 tablet (10 mg total) by mouth daily.  . Cholecalciferol (VITAMIN D PO) Take 2,000 Units by mouth daily.  [name] Co-Enzyme Q-10 30 MG CAPS Take 10 mg by mouth daily.  [name] donepezil (ARICEPT) 10 MG tablet Take 10 mg by mouth daily.  [name] imipramine (TOFRANIL) 10 MG tablet Take 10 mg by mouth 2 (two) times daily.  . magnesium oxide (MAG-OX) 400 MG tablet Take 400 mg by mouth daily.  . Memantine HCl-Donepezil HCl (NAMZARIC) 28-10 MG CP24 Take 1 capsule by mouth daily.  . metoprolol succinate (TOPROL-XL) 50 MG 24 hr tablet Take 1 tablet (50 mg total) by mouth daily.  . Multiple Vitamin (MULTI-VITAMINS) TABS Take 1 tablet by mouth daily.  [name] NIFEdipine (PROCARDIA-XL/ADALAT-CC/NIFEDICAL-XL) 30 MG 24 hr  tablet TK 1 T PO D  . ramipril (ALTACE) 2.5 MG capsule Take 1 capsule (2.5 mg total) by mouth daily.  . vitamin B-12 (CYANOCOBALAMIN) 1000 MCG tablet Take 1 tablet by mouth daily.     Allergies:   Patient has no known allergies.   Social History   Socioeconomic History  . Marital status: Married    Spouse name: Not on file  . Number of children: Not on file  . Years of education: Not on file  . Highest education level: Not on file  Occupational History  . Not on file  Social Needs  . Financial resource strain: Not on file  . Food insecurity:    Worry: Not on file      Inability: Not on file  . Transportation needs:    Medical: Not on file    Non-medical: Not on file  Tobacco Use  . Smoking status: Former Research scientist (life sciences)  . Smokeless tobacco: Never Used  Substance and Sexual Activity  . Alcohol use: No  . Drug use: No  . Sexual activity: Not on file  Lifestyle  . Physical activity:    Days per week: Not on file    Minutes per session: Not on file  . Stress: Not on file  Relationships  . Social connections:    Talks on phone: Not on file    Gets together: Not on file    Attends religious service: Not on file    Active member of club or organization: Not on file    Attends meetings of clubs or organizations: Not on file    Relationship status: Not on file  Other Topics Concern  . Not on file  Social History Narrative  . Not on file     Family History: The patient's family history includes CAD in his brother, mother, and sister. ROS:   Please see the history of present illness.    All other systems reviewed and are negative.  EKGs/Labs/Other Studies Reviewed:    The following studies were reviewed today:  EKG:  EKG ordered today.  The ekg ordered today demonstrates sinus rhythm right bundle branch block  Recent Labs: No results found for requested labs within last 8760 hours.  Recent Lipid Panel    Component Value Date/Time   CHOL 164 02/06/2017 1205   TRIG 123 02/06/2017 1205   HDL 43 02/06/2017 1205   LDLCALC 96 02/06/2017 1205    Physical Exam:    VS:  BP 126/80 (BP Location: Left Arm, Patient Position: Sitting, Cuff Size: Normal)   Pulse 66   Ht '5\' 8"'$  (1.727 m)   Wt 157 lb 9.6 oz (71.5 kg)   SpO2 92%   BMI 23.96 kg/m     Wt Readings from Last 3 Encounters:  06/30/18 157 lb 9.6 oz (71.5 kg)  09/07/17 163 lb (73.9 kg)  02/06/17 157 lb 1.9 oz (71.3 kg)     GEN:  Well nourished, well developed in no acute distress HEENT: Normal NECK: No JVD; No carotid bruits LYMPHATICS: No lymphadenopathy CARDIAC: RRR, no murmurs,  rubs, gallops RESPIRATORY:  Clear to auscultation without rales, wheezing or rhonchi  ABDOMEN: Soft, non-tender, non-distended MUSCULOSKELETAL:  No edema; No deformity  SKIN: Warm and dry NEUROLOGIC:  Alert and oriented x 3 PSYCHIATRIC:  Normal affect    Signed, Shirlee More, MD  06/30/2018 4:19 PM    [facility]

## 2018-07-01 LAB — COMPREHENSIVE METABOLIC PANEL
A/G RATIO: 2.3 — AB (ref 1.2–2.2)
ALBUMIN: 4.8 g/dL — AB (ref 3.5–4.7)
ALK PHOS: 94 IU/L (ref 39–117)
ALT: 24 IU/L (ref 0–44)
AST: 26 IU/L (ref 0–40)
BILIRUBIN TOTAL: 0.4 mg/dL (ref 0.0–1.2)
BUN / CREAT RATIO: 14 (ref 10–24)
BUN: 18 mg/dL (ref 8–27)
CHLORIDE: 101 mmol/L (ref 96–106)
CO2: 24 mmol/L (ref 20–29)
CREATININE: 1.32 mg/dL — AB (ref 0.76–1.27)
Calcium: 9.8 mg/dL (ref 8.6–10.2)
GFR calc Af Amer: 56 mL/min/{1.73_m2} — ABNORMAL LOW (ref >59)
GFR calc non Af Amer: 49 mL/min/{1.73_m2} — ABNORMAL LOW (ref >59)
GLOBULIN, TOTAL: 2.1 g/dL (ref 1.5–4.5)
Glucose: 91 mg/dL (ref 65–99)
POTASSIUM: 4.3 mmol/L (ref 3.5–5.2)
SODIUM: 142 mmol/L (ref 134–144)
Total Protein: 6.9 g/dL (ref 6.0–8.5)

## 2018-07-01 LAB — LIPID PANEL
CHOLESTEROL TOTAL: 184 mg/dL (ref 100–199)
Chol/HDL Ratio: 4 ratio (ref 0.0–5.0)
HDL: 46 mg/dL (ref >39)
LDL Calculated: 80 mg/dL (ref 0–99)
TRIGLYCERIDES: 292 mg/dL — AB (ref 0–149)
VLDL CHOLESTEROL CAL: 58 mg/dL — AB (ref 5–40)

## 2018-07-05 DIAGNOSIS — C61 Malignant neoplasm of prostate: Secondary | ICD-10-CM | POA: Diagnosis not present

## 2018-07-05 DIAGNOSIS — R32 Unspecified urinary incontinence: Secondary | ICD-10-CM | POA: Diagnosis not present

## 2018-07-12 DIAGNOSIS — G301 Alzheimer's disease with late onset: Secondary | ICD-10-CM | POA: Diagnosis not present

## 2018-07-12 DIAGNOSIS — F0280 Dementia in other diseases classified elsewhere without behavioral disturbance: Secondary | ICD-10-CM | POA: Diagnosis not present

## 2018-08-05 DIAGNOSIS — I6523 Occlusion and stenosis of bilateral carotid arteries: Secondary | ICD-10-CM | POA: Diagnosis not present

## 2018-08-05 DIAGNOSIS — Z9889 Other specified postprocedural states: Secondary | ICD-10-CM | POA: Diagnosis not present

## 2018-08-05 DIAGNOSIS — I714 Abdominal aortic aneurysm, without rupture: Secondary | ICD-10-CM | POA: Diagnosis not present

## 2018-08-05 DIAGNOSIS — Z8679 Personal history of other diseases of the circulatory system: Secondary | ICD-10-CM

## 2018-08-05 DIAGNOSIS — I10 Essential (primary) hypertension: Secondary | ICD-10-CM | POA: Diagnosis not present

## 2018-08-05 HISTORY — DX: Personal history of other diseases of the circulatory system: Z86.79

## 2018-08-18 DIAGNOSIS — C61 Malignant neoplasm of prostate: Secondary | ICD-10-CM | POA: Diagnosis not present

## 2018-08-18 DIAGNOSIS — R32 Unspecified urinary incontinence: Secondary | ICD-10-CM | POA: Diagnosis not present

## 2018-08-24 ENCOUNTER — Telehealth: Payer: Self-pay | Admitting: Cardiology

## 2018-08-24 ENCOUNTER — Other Ambulatory Visit: Payer: Self-pay | Admitting: *Deleted

## 2018-08-24 MED ORDER — RAMIPRIL 2.5 MG PO CAPS: 2.5000 mg | ORAL_CAPSULE | Freq: Every day | ORAL | 1 refills | Status: DC

## 2018-08-24 MED ORDER — METOPROLOL SUCCINATE ER 50 MG PO TB24: 50.0000 mg | ORAL_TABLET | Freq: Every day | ORAL | 1 refills | Status: DC

## 2018-08-24 NOTE — Telephone Encounter (Signed)
°*  STAT* If patient is at the pharmacy, call can be transferred to refill team.   1. Which medications need to be refilled? (please list name of each medication and dose if known) Ramipril 2.5mg  takes 1 daily   2. Which pharmacy/location (including street and city if local pharmacy) is medication to be sent to?[facility] on [address]   3. Do they need a 30 day or 90 day supply? [place]

## 2018-08-24 NOTE — Telephone Encounter (Signed)
Refill for ramipril sent to [facility] in [place] as requested.

## 2018-10-26 DIAGNOSIS — K5909 Other constipation: Secondary | ICD-10-CM | POA: Diagnosis not present

## 2018-11-01 ENCOUNTER — Other Ambulatory Visit: Payer: Self-pay

## 2018-11-01 MED ORDER — ATORVASTATIN CALCIUM 10 MG PO TABS: 10.0000 mg | ORAL_TABLET | Freq: Every day | ORAL | 2 refills | Status: DC

## 2018-11-17 DIAGNOSIS — Z136 Encounter for screening for cardiovascular disorders: Secondary | ICD-10-CM | POA: Diagnosis not present

## 2018-11-17 DIAGNOSIS — E785 Hyperlipidemia, unspecified: Secondary | ICD-10-CM | POA: Diagnosis not present

## 2018-11-17 DIAGNOSIS — Z9181 History of falling: Secondary | ICD-10-CM | POA: Diagnosis not present

## 2018-11-17 DIAGNOSIS — Z1331 Encounter for screening for depression: Secondary | ICD-10-CM | POA: Diagnosis not present

## 2018-11-17 DIAGNOSIS — Z0000 Encounter for general adult medical examination without abnormal findings: Secondary | ICD-10-CM | POA: Diagnosis not present

## 2018-11-24 DIAGNOSIS — M7741 Metatarsalgia, right foot: Secondary | ICD-10-CM

## 2018-11-24 DIAGNOSIS — M79671 Pain in right foot: Secondary | ICD-10-CM | POA: Diagnosis not present

## 2018-11-24 DIAGNOSIS — M79672 Pain in left foot: Secondary | ICD-10-CM | POA: Diagnosis not present

## 2018-11-24 DIAGNOSIS — M21611 Bunion of right foot: Secondary | ICD-10-CM | POA: Diagnosis not present

## 2018-11-24 DIAGNOSIS — M21612 Bunion of left foot: Secondary | ICD-10-CM | POA: Diagnosis not present

## 2018-11-24 DIAGNOSIS — B351 Tinea unguium: Secondary | ICD-10-CM

## 2018-11-24 DIAGNOSIS — M7742 Metatarsalgia, left foot: Secondary | ICD-10-CM | POA: Diagnosis not present

## 2018-11-24 DIAGNOSIS — L84 Corns and callosities: Secondary | ICD-10-CM

## 2018-11-24 HISTORY — DX: Metatarsalgia, right foot: M77.41

## 2018-11-24 HISTORY — DX: Corns and callosities: L84

## 2018-11-24 HISTORY — DX: Tinea unguium: B35.1

## 2018-12-31 ENCOUNTER — Other Ambulatory Visit: Payer: Self-pay | Admitting: Cardiology

## 2019-01-08 ENCOUNTER — Other Ambulatory Visit: Payer: Self-pay | Admitting: Cardiology

## 2019-01-10 DIAGNOSIS — G301 Alzheimer's disease with late onset: Secondary | ICD-10-CM | POA: Diagnosis not present

## 2019-01-10 DIAGNOSIS — F0280 Dementia in other diseases classified elsewhere without behavioral disturbance: Secondary | ICD-10-CM | POA: Diagnosis not present

## 2019-02-09 ENCOUNTER — Telehealth: Payer: Self-pay | Admitting: *Deleted

## 2019-02-09 MED ORDER — RAMIPRIL 2.5 MG PO CAPS: 2.5000 mg | ORAL_CAPSULE | Freq: Every day | ORAL | 0 refills | Status: DC

## 2019-02-09 NOTE — Telephone Encounter (Signed)
Rx refill sent to pharmacy.  *STAT* If patient is at the pharmacy, call can be transferred to refill team.   1. Which medications need to be refilled? (please list name of each medication and dose if known) Ramipril 2.5 mg qd  2. Which pharmacy/location (including street and city if local pharmacy) is medication to be sent to?[facility]  3. Do they need a 30 day or 90 day supply? [place]

## 2019-02-17 DIAGNOSIS — R32 Unspecified urinary incontinence: Secondary | ICD-10-CM | POA: Diagnosis not present

## 2019-02-17 DIAGNOSIS — C61 Malignant neoplasm of prostate: Secondary | ICD-10-CM | POA: Diagnosis not present

## 2019-02-27 NOTE — Progress Notes (Signed)
Cardiology Office Note:    Date:  02/28/2019   ID:  Justin Lester, DOB Apr 10, 1931, MRN [id number]  PCP:  [name], MD  Cardiologist:  Shirlee More, MD    Referring MD: [name], MD    ASSESSMENT:    1. Coronary artery disease involving native coronary artery of native heart with angina pectoris ([place])   2. Essential hypertension   3. Hyperlipidemia, unspecified hyperlipidemia type   4. RBBB   5. Bilateral carotid artery stenosis    PLAN:    In order of problems listed above:  1. CAD stable New York Heart Association class I after revascularization continue medical therapy including aspirin high intensity statin beta-blocker.  Patient and his wife have stopped his calcium channel blocker. 2. Stable hypertension BP at target continue current treatment 3. Stable hyperlipidemia continue high intensity statin check liver function for safety lipid profile for efficacy 4. Stable EKG pattern 5. Followed by vascular surgery [facility] continue aspirin antihypertensives and statin   Next appointment: 6 months   Medication Adjustments/Labs and Tests Ordered: Current medicines are reviewed at length with the patient today.  Concerns regarding medicines are outlined above.  No orders of the defined types were placed in this encounter.  No orders of the defined types were placed in this encounter.   Chief Complaint  Patient presents with  . Follow-up  . Coronary Artery Disease  . Hyperlipidemia  . Hypertension    History of Present Illness:    Justin Lester is a 85 y.o. male with a hx of  CAD, S/P CABG, CHF, Dyslipidemia, HTN, Peripheral Vascular Disease, with EVAR of AAA  last seen 06/30/2018. Compliance with diet, lifestyle and medications: Yes  [place] seen along with his wife.  They are really under a great deal of stress with COVID-19 in isolation.  He has had no chest pain edema shortness of breath palpitation or syncope and follows vascular surgery  [facility] for his carotid disease. Past Medical History:  Diagnosis Date  . Dementia ([place])   . Esophageal reflux   . Old myocardial infarct   . Prostate cancer [facility])     Past Surgical History:  Procedure Laterality Date  . ABDOMINAL AORTA STENT    . CORONARY ARTERY BYPASS GRAFT    . HERNIA REPAIR    . PROSTATE SURGERY      Current Medications: Current Meds  Medication Sig  . aspirin EC 81 MG tablet Take 81 mg by mouth every other day.   [name] atorvastatin (LIPITOR) 10 MG tablet TAKE 1 TABLET(10 MG) BY MOUTH DAILY  . Cholecalciferol (VITAMIN D PO) Take 2,000 Units by mouth daily.  [name] Co-Enzyme Q-10 30 MG CAPS Take 10 mg by mouth daily.  [name] donepezil (ARICEPT) 10 MG tablet Take 10 mg by mouth daily.  [name] imipramine (TOFRANIL) 10 MG tablet Take 10 mg by mouth 2 (two) times daily.  . magnesium oxide (MAG-OX) 400 MG tablet Take 400 mg by mouth daily.  . Memantine HCl-Donepezil HCl (NAMZARIC) 28-10 MG CP24 Take 1 capsule by mouth daily.  . metoprolol succinate (TOPROL-XL) 50 MG 24 hr tablet TAKE 1 TABLET BY MOUTH  DAILY  . Multiple Vitamin (MULTI-VITAMINS) TABS Take 1 tablet by mouth daily.  [name] NIFEdipine (PROCARDIA-XL/ADALAT-CC/NIFEDICAL-XL) 30 MG 24 hr tablet TK 1 T PO D  . ramipril (ALTACE) 2.5 MG capsule Take 1 capsule (2.5 mg total) by mouth daily.  . vitamin B-12 (CYANOCOBALAMIN) 1000 MCG tablet Take 1 tablet  by mouth daily.     Allergies:   Patient has no known allergies.   Social History   Socioeconomic History  . Marital status: Married    Spouse name: Not on file  . Number of children: Not on file  . Years of education: Not on file  . Highest education level: Not on file  Occupational History  . Not on file  Social Needs  . Financial resource strain: Not on file  . Food insecurity    Worry: Not on file    Inability: Not on file  . Transportation needs    Medical: Not on file    Non-medical: Not on file  Tobacco Use  . Smoking status: Former Research scientist (life sciences)  .  Smokeless tobacco: Never Used  Substance and Sexual Activity  . Alcohol use: No  . Drug use: No  . Sexual activity: Not on file  Lifestyle  . Physical activity    Days per week: Not on file    Minutes per session: Not on file  . Stress: Not on file  Relationships  . Social Herbalist on phone: Not on file    Gets together: Not on file    Attends religious service: Not on file    Active member of club or organization: Not on file    Attends meetings of clubs or organizations: Not on file    Relationship status: Not on file  Other Topics Concern  . Not on file  Social History Narrative  . Not on file     Family History: The patient's family history includes CAD in his brother, mother, and sister. ROS:   Please see the history of present illness.    All other systems reviewed and are negative.  EKGs/Labs/Other Studies Reviewed:    The following studies were reviewed today:  EKG:  EKG ordered today and personally reviewed.  The ekg ordered today demonstrates sinus rhythm right bundle branch block  Recent Labs: 06/30/2018: ALT 24; BUN 18; Creatinine, Ser 1.32; Potassium 4.3; Sodium 142  Recent Lipid Panel    Component Value Date/Time   CHOL 184 06/30/2018 1621   TRIG 292 (H) 06/30/2018 1621   HDL 46 06/30/2018 1621   CHOLHDL 4.0 06/30/2018 1621   LDLCALC 80 06/30/2018 1621    Physical Exam:    VS:  BP 120/86 (BP Location: Right Arm, Patient Position: Sitting, Cuff Size: Normal)   Pulse 60   Temp 98.5 F (36.9 C)   Ht 5\' 8"  (1.727 m)   Wt 163 lb (73.9 kg)   SpO2 96%   BMI 24.78 kg/m     Wt Readings from Last 3 Encounters:  02/28/19 163 lb (73.9 kg)  06/30/18 157 lb 9.6 oz (71.5 kg)  09/07/17 163 lb (73.9 kg)     GEN:  Well nourished, well developed in no acute distress HEENT: Normal NECK: No JVD; No carotid bruits LYMPHATICS: No lymphadenopathy CARDIAC: RRR, no murmurs, rubs, gallops RESPIRATORY:  Clear to auscultation without rales, wheezing  or rhonchi  ABDOMEN: Soft, non-tender, non-distended MUSCULOSKELETAL:  No edema; No deformity  SKIN: Warm and dry NEUROLOGIC:  Alert and oriented x 3 PSYCHIATRIC:  Normal affect    Signed, Shirlee More, MD  02/28/2019 11:26 AM    [place]

## 2019-02-28 ENCOUNTER — Ambulatory Visit (INDEPENDENT_AMBULATORY_CARE_PROVIDER_SITE_OTHER): Payer: Medicare Other | Admitting: Cardiology

## 2019-02-28 ENCOUNTER — Encounter: Payer: Self-pay | Admitting: Cardiology

## 2019-02-28 ENCOUNTER — Other Ambulatory Visit: Payer: Self-pay

## 2019-02-28 VITALS — BP 120/86 | HR 60 | Temp 98.5°F | Ht 68.0 in | Wt 163.0 lb

## 2019-02-28 DIAGNOSIS — I25119 Atherosclerotic heart disease of native coronary artery with unspecified angina pectoris: Secondary | ICD-10-CM | POA: Diagnosis not present

## 2019-02-28 DIAGNOSIS — I6523 Occlusion and stenosis of bilateral carotid arteries: Secondary | ICD-10-CM

## 2019-02-28 DIAGNOSIS — I4510 Unspecified right bundle-branch block: Secondary | ICD-10-CM

## 2019-02-28 DIAGNOSIS — E785 Hyperlipidemia, unspecified: Secondary | ICD-10-CM

## 2019-02-28 DIAGNOSIS — I10 Essential (primary) hypertension: Secondary | ICD-10-CM

## 2019-02-28 NOTE — Patient Instructions (Signed)
Medication Instructions:  Your physician recommends that you continue on your current medications as directed. Please refer to the Current Medication list given to you today.  If you need a refill on your cardiac medications before your next appointment, please call your pharmacy.   Lab work: Your physician recommends that you return for lab work today: CMP, lipid profile.  If you have labs (blood work) drawn today and your tests are completely normal, you will receive your results only by: [name] MyChart Message (if you have MyChart) OR . A paper copy in the mail If you have any lab test that is abnormal or we need to change your treatment, we will call you to review the results.  Testing/Procedures: You had an EKG today.   Follow-Up: At [facility], you and your health needs are our priority.  As part of our continuing mission to provide you with exceptional heart care, we have created designated Provider Care Teams.  These Care Teams include your primary Cardiologist (physician) and Advanced Practice Providers (APPs -  Physician Assistants and Nurse Practitioners) who all work together to provide you with the care you need, when you need it. You will need a follow up appointment in 6 months.  Please call our office 2 months in advance to schedule this appointment.

## 2019-03-01 ENCOUNTER — Telehealth: Payer: Self-pay

## 2019-03-01 LAB — COMPREHENSIVE METABOLIC PANEL
ALT: 23 IU/L (ref 0–44)
AST: 22 IU/L (ref 0–40)
Albumin/Globulin Ratio: 2.4 — ABNORMAL HIGH (ref 1.2–2.2)
Albumin: 4.7 g/dL — ABNORMAL HIGH (ref 3.6–4.6)
Alkaline Phosphatase: 96 IU/L (ref 39–117)
BUN/Creatinine Ratio: 14 (ref 10–24)
BUN: 16 mg/dL (ref 8–27)
Bilirubin Total: 0.6 mg/dL (ref 0.0–1.2)
CO2: 24 mmol/L (ref 20–29)
Calcium: 9.5 mg/dL (ref 8.6–10.2)
Chloride: 98 mmol/L (ref 96–106)
Creatinine, Ser: 1.14 mg/dL (ref 0.76–1.27)
GFR calc Af Amer: 67 mL/min/{1.73_m2} (ref >59)
GFR calc non Af Amer: 58 mL/min/{1.73_m2} — ABNORMAL LOW (ref >59)
Globulin, Total: 2 g/dL (ref 1.5–4.5)
Glucose: 96 mg/dL (ref 65–99)
Potassium: 4.2 mmol/L (ref 3.5–5.2)
Sodium: 141 mmol/L (ref 134–144)
Total Protein: 6.7 g/dL (ref 6.0–8.5)

## 2019-03-01 LAB — LIPID PANEL
Chol/HDL Ratio: 4.4 ratio (ref 0.0–5.0)
Cholesterol, Total: 183 mg/dL (ref 100–199)
HDL: 42 mg/dL (ref >39)
LDL Calculated: 107 mg/dL — ABNORMAL HIGH (ref 0–99)
Triglycerides: 170 mg/dL — ABNORMAL HIGH (ref 0–149)
VLDL Cholesterol Cal: 34 mg/dL (ref 5–40)

## 2019-03-01 NOTE — Telephone Encounter (Signed)
-----   Message from Richardo Priest, MD sent at 03/01/2019  8:17 AM EDT ----- Good result no changes

## 2019-03-01 NOTE — Telephone Encounter (Signed)
Called but uUnable to lvm, mailbox not setup yet.

## 2019-03-24 DIAGNOSIS — R233 Spontaneous ecchymoses: Secondary | ICD-10-CM | POA: Diagnosis not present

## 2019-03-24 DIAGNOSIS — L821 Other seborrheic keratosis: Secondary | ICD-10-CM | POA: Diagnosis not present

## 2019-03-24 DIAGNOSIS — L570 Actinic keratosis: Secondary | ICD-10-CM | POA: Diagnosis not present

## 2019-03-24 DIAGNOSIS — C4441 Basal cell carcinoma of skin of scalp and neck: Secondary | ICD-10-CM | POA: Diagnosis not present

## 2019-03-24 DIAGNOSIS — L578 Other skin changes due to chronic exposure to nonionizing radiation: Secondary | ICD-10-CM | POA: Diagnosis not present

## 2019-03-30 ENCOUNTER — Other Ambulatory Visit: Payer: Self-pay | Admitting: Cardiology

## 2019-03-31 DIAGNOSIS — C4441 Basal cell carcinoma of skin of scalp and neck: Secondary | ICD-10-CM | POA: Diagnosis not present

## 2019-04-20 ENCOUNTER — Other Ambulatory Visit: Payer: Self-pay | Admitting: Cardiology

## 2019-04-29 ENCOUNTER — Other Ambulatory Visit: Payer: Self-pay | Admitting: Podiatry

## 2019-04-29 DIAGNOSIS — M25571 Pain in right ankle and joints of right foot: Secondary | ICD-10-CM

## 2019-05-02 ENCOUNTER — Ambulatory Visit (INDEPENDENT_AMBULATORY_CARE_PROVIDER_SITE_OTHER): Payer: Medicare Other | Admitting: Podiatry

## 2019-05-02 ENCOUNTER — Other Ambulatory Visit: Payer: Self-pay

## 2019-05-02 ENCOUNTER — Ambulatory Visit (INDEPENDENT_AMBULATORY_CARE_PROVIDER_SITE_OTHER): Payer: Medicare Other

## 2019-05-02 DIAGNOSIS — I872 Venous insufficiency (chronic) (peripheral): Secondary | ICD-10-CM

## 2019-05-02 DIAGNOSIS — M25571 Pain in right ankle and joints of right foot: Secondary | ICD-10-CM

## 2019-05-02 DIAGNOSIS — R2681 Unsteadiness on feet: Secondary | ICD-10-CM

## 2019-05-02 DIAGNOSIS — M8430XA Stress fracture, unspecified site, initial encounter for fracture: Secondary | ICD-10-CM

## 2019-05-02 NOTE — Progress Notes (Signed)
  Subjective:  Patient ID: Justin Lester, male    DOB: [birth year],  MRN: [id number]  Chief Complaint  Patient presents with  . Foot Pain    Rt ankel pain lateral side x 2 mo; 10/10 shapr pains (intermittent) -pt denies injury -w/ swellign -worse with walking Tx: none     85 y.o. male presents with the above complaint. Hx as above. Feels unstable at the right ankle concerned about falling. Swelling to both legs but worse on the right.  Review of Systems: Negative except as noted in the HPI. Denies N/V/F/Ch.  Past Medical History:  Diagnosis Date  . Dementia ([place])   . Esophageal reflux   . Old myocardial infarct   . Prostate cancer [facility])     Current Outpatient Medications:  .  aspirin EC 81 MG tablet, Take 81 mg by mouth every other day. , Disp: , Rfl:  .  atorvastatin (LIPITOR) 10 MG tablet, TAKE 1 TABLET(10 MG) BY MOUTH DAILY, Disp: 30 tablet, Rfl: 0 .  Cholecalciferol (VITAMIN D PO), Take 2,000 Units by mouth daily., Disp: , Rfl:  .  Co-Enzyme Q-10 30 MG CAPS, Take 10 mg by mouth daily., Disp: , Rfl:  .  donepezil (ARICEPT) 10 MG tablet, Take 10 mg by mouth daily., Disp: , Rfl:  .  imipramine (TOFRANIL) 10 MG tablet, Take 10 mg by mouth 2 (two) times daily., Disp: , Rfl:  .  magnesium oxide (MAG-OX) 400 MG tablet, Take 400 mg by mouth daily., Disp: , Rfl:  .  Memantine HCl-Donepezil HCl (NAMZARIC) 28-10 MG CP24, Take 1 capsule by mouth daily., Disp: , Rfl:  .  metoprolol succinate (TOPROL-XL) 50 MG 24 hr tablet, TAKE 1 TABLET BY MOUTH  DAILY, Disp: 90 tablet, Rfl: 0 .  Multiple Vitamin (MULTI-VITAMINS) TABS, Take 1 tablet by mouth daily., Disp: , Rfl:  .  NIFEdipine (PROCARDIA-XL/ADALAT-CC/NIFEDICAL-XL) 30 MG 24 hr tablet, TK 1 T PO D, Disp: , Rfl: 1 .  ramipril (ALTACE) 2.5 MG capsule, TAKE 1 CAPSULE BY MOUTH  DAILY, Disp: 90 capsule, Rfl: 2 .  vitamin B-12 (CYANOCOBALAMIN) 1000 MCG tablet, Take 1 tablet by mouth daily., Disp: , Rfl:   Social History   Tobacco Use  Smoking  Status Former Smoker  Smokeless Tobacco Never Used    No Known Allergies Objective:  There were no vitals filed for this visit. There is no height or weight on file to calculate BMI. Constitutional Well developed. Well nourished.  Vascular Dorsalis pedis pulses palpable bilaterally. Posterior tibial pulses palpable bilaterally. Capillary refill normal to all digits.  No cyanosis or clubbing noted. Pedal hair growth normal.  Neurologic Normal speech. Oriented to person, place, and time. Epicritic sensation to light touch grossly present bilaterally.  Dermatologic Nails well groomed and normal in appearance. No open wounds. No skin lesions.  Orthopedic: POP right fibula, No pain at ATFL or Sinus tarsi. Neg anterior drawer.   Radiographs: taken and reviewed no evidence of fracture or dislocation. Increased ST density.  Assessment:   1. Stress reaction of bone   2. Acute right ankle pain   3. Gait instability   4. Venous (peripheral) insufficiency    Plan:  Patient was evaluated and treated and all questions answered.  R Fibular Stress Reaction, no definite stress fracture -XR as above -Dispense CAM Boot. -Immobilize for 2 weeks  Venous Insufficiency -Dispense tubigrip  Return in about 2 weeks (around 05/16/2019) for Stress Reaction right.

## 2019-05-04 ENCOUNTER — Other Ambulatory Visit: Payer: Self-pay | Admitting: Podiatry

## 2019-05-04 DIAGNOSIS — M25571 Pain in right ankle and joints of right foot: Secondary | ICD-10-CM

## 2019-05-04 DIAGNOSIS — M8430XA Stress fracture, unspecified site, initial encounter for fracture: Secondary | ICD-10-CM

## 2019-06-10 DIAGNOSIS — N3945 Continuous leakage: Secondary | ICD-10-CM

## 2019-06-10 DIAGNOSIS — G301 Alzheimer's disease with late onset: Secondary | ICD-10-CM | POA: Diagnosis not present

## 2019-06-10 DIAGNOSIS — F0280 Dementia in other diseases classified elsewhere without behavioral disturbance: Secondary | ICD-10-CM

## 2019-06-10 HISTORY — DX: Continuous leakage: N39.45

## 2019-06-10 HISTORY — DX: Dementia in other diseases classified elsewhere, unspecified severity, without behavioral disturbance, psychotic disturbance, mood disturbance, and anxiety: F02.80

## 2019-06-12 ENCOUNTER — Other Ambulatory Visit: Payer: Self-pay | Admitting: Cardiology

## 2019-06-14 NOTE — Telephone Encounter (Signed)
Atorvastatin refill sent to [facility]

## 2019-07-20 ENCOUNTER — Other Ambulatory Visit: Payer: Self-pay | Admitting: Cardiology

## 2019-08-30 DIAGNOSIS — C4441 Basal cell carcinoma of skin of scalp and neck: Secondary | ICD-10-CM | POA: Diagnosis not present

## 2019-08-30 DIAGNOSIS — L570 Actinic keratosis: Secondary | ICD-10-CM | POA: Diagnosis not present

## 2019-08-31 DIAGNOSIS — I10 Essential (primary) hypertension: Secondary | ICD-10-CM | POA: Diagnosis not present

## 2019-08-31 DIAGNOSIS — Z9889 Other specified postprocedural states: Secondary | ICD-10-CM | POA: Diagnosis not present

## 2019-08-31 DIAGNOSIS — I714 Abdominal aortic aneurysm, without rupture: Secondary | ICD-10-CM | POA: Diagnosis not present

## 2019-08-31 DIAGNOSIS — Z8679 Personal history of other diseases of the circulatory system: Secondary | ICD-10-CM | POA: Diagnosis not present

## 2019-08-31 DIAGNOSIS — I25119 Atherosclerotic heart disease of native coronary artery with unspecified angina pectoris: Secondary | ICD-10-CM | POA: Diagnosis not present

## 2019-08-31 DIAGNOSIS — I6523 Occlusion and stenosis of bilateral carotid arteries: Secondary | ICD-10-CM | POA: Diagnosis not present

## 2019-09-25 ENCOUNTER — Other Ambulatory Visit: Payer: Self-pay | Admitting: Cardiology

## 2019-09-27 ENCOUNTER — Ambulatory Visit (INDEPENDENT_AMBULATORY_CARE_PROVIDER_SITE_OTHER): Payer: Medicare Other | Admitting: Podiatry

## 2019-09-27 ENCOUNTER — Other Ambulatory Visit: Payer: Self-pay

## 2019-09-27 DIAGNOSIS — B351 Tinea unguium: Secondary | ICD-10-CM | POA: Diagnosis not present

## 2019-09-27 DIAGNOSIS — M79609 Pain in unspecified limb: Secondary | ICD-10-CM

## 2019-09-27 DIAGNOSIS — L97521 Non-pressure chronic ulcer of other part of left foot limited to breakdown of skin: Secondary | ICD-10-CM

## 2019-09-27 NOTE — Progress Notes (Signed)
  Subjective:  Patient ID: Justin Lester, male    DOB: [birth year],  MRN: [id number]  No chief complaint on file.   86 y.o. male presents with the above complaint. History confirmed with patient. Endorses painful callused lesions and pain in the nails  Objective:  Physical Exam: warm, good capillary refill, nail exam onychomycosis of the toenails and pain to palpation of the nails, no trophic changes or ulcerative lesions, normal DP and PT pulses and normal sensory exam. Left Foot: HPK submet 2 with small open ulcer noted upon debridement. No warmth, erythema, signs of acute infection noted  Right Foot: hpk right submet 5 without open ulcer    Assessment:   1. Pain due to onychomycosis of nail   2. Ulcerated, foot, left, limited to breakdown of skin [facility])    Plan:  Patient was evaluated and treated and all questions answered.  Onychomycosis with pain to palpation -Nails palliatively debrided secondary to pain  Procedure: Nail Debridement Rationale: pain Type of Debridement: manual, sharp debridement. Instrumentation: Nail nipper, rotary burr. Number of Nails: 10  Ulcer left submet 2 -Ulcer debrided to healthy viable tissue. Bleeding staunched with silver nitrate. Abx ointment and bandaid applied. Pt to apply daily and notify should signs of infection present.  No follow-ups on file.

## 2019-09-30 DIAGNOSIS — F0280 Dementia in other diseases classified elsewhere without behavioral disturbance: Secondary | ICD-10-CM | POA: Diagnosis not present

## 2019-09-30 DIAGNOSIS — G309 Alzheimer's disease, unspecified: Secondary | ICD-10-CM | POA: Diagnosis not present

## 2019-10-25 ENCOUNTER — Ambulatory Visit: Payer: Medicare Other | Admitting: Podiatry

## 2019-10-27 DIAGNOSIS — M25571 Pain in right ankle and joints of right foot: Secondary | ICD-10-CM | POA: Diagnosis not present

## 2019-10-27 DIAGNOSIS — S8991XA Unspecified injury of right lower leg, initial encounter: Secondary | ICD-10-CM | POA: Diagnosis not present

## 2019-11-22 DIAGNOSIS — H6122 Impacted cerumen, left ear: Secondary | ICD-10-CM | POA: Diagnosis not present

## 2019-11-22 DIAGNOSIS — E785 Hyperlipidemia, unspecified: Secondary | ICD-10-CM | POA: Diagnosis not present

## 2019-11-22 DIAGNOSIS — Z9181 History of falling: Secondary | ICD-10-CM | POA: Diagnosis not present

## 2019-11-22 DIAGNOSIS — I10 Essential (primary) hypertension: Secondary | ICD-10-CM | POA: Diagnosis not present

## 2019-11-22 DIAGNOSIS — I2510 Atherosclerotic heart disease of native coronary artery without angina pectoris: Secondary | ICD-10-CM | POA: Diagnosis not present

## 2019-11-22 DIAGNOSIS — Z1331 Encounter for screening for depression: Secondary | ICD-10-CM | POA: Diagnosis not present

## 2019-11-22 DIAGNOSIS — Z139 Encounter for screening, unspecified: Secondary | ICD-10-CM | POA: Diagnosis not present

## 2019-11-23 DIAGNOSIS — E785 Hyperlipidemia, unspecified: Secondary | ICD-10-CM | POA: Diagnosis not present

## 2019-11-23 DIAGNOSIS — Z9181 History of falling: Secondary | ICD-10-CM | POA: Diagnosis not present

## 2019-11-23 DIAGNOSIS — Z1331 Encounter for screening for depression: Secondary | ICD-10-CM | POA: Diagnosis not present

## 2019-11-23 DIAGNOSIS — Z139 Encounter for screening, unspecified: Secondary | ICD-10-CM | POA: Diagnosis not present

## 2019-11-23 DIAGNOSIS — Z0000 Encounter for general adult medical examination without abnormal findings: Secondary | ICD-10-CM | POA: Diagnosis not present

## 2019-11-29 DIAGNOSIS — F0280 Dementia in other diseases classified elsewhere without behavioral disturbance: Secondary | ICD-10-CM | POA: Diagnosis not present

## 2019-11-29 DIAGNOSIS — G301 Alzheimer's disease with late onset: Secondary | ICD-10-CM | POA: Diagnosis not present

## 2019-12-18 ENCOUNTER — Other Ambulatory Visit: Payer: Self-pay | Admitting: Cardiology

## 2020-01-19 DIAGNOSIS — M1611 Unilateral primary osteoarthritis, right hip: Secondary | ICD-10-CM | POA: Diagnosis not present

## 2020-02-13 NOTE — Progress Notes (Signed)
Cardiology Office Note:    Date:  02/14/2020   ID:  Justin Lester, DOB 03/11/1931, MRN [id number]  PCP:  Justin Dress, MD  Cardiologist:  Justin More, MD    Referring MD: Justin Dress, MD    ASSESSMENT:    1. Coronary artery disease involving native coronary artery of native heart with angina pectoris ([place])   2. Essential hypertension   3. Hyperlipidemia, unspecified hyperlipidemia type    PLAN:    In order of problems listed above:  1. Stable CAD having no angina on current medical therapy including aspirin beta-blocker and high intensity statin.  At this time I do not think he requires an ischemia evaluation.  With heart rate in the 50s and taking both Aricept and Namenda that can blunt his heart rate I think will reduce his beta-blocker dose to 50% 2. Stable BP at target continue current treatment including calcium channel blocker 3. Continue his high intensity statin due for labs we will draw CMP and lipid profile today   Next appointment: 1 year or sooner if needed   Medication Adjustments/Labs and Tests Ordered: Current medicines are reviewed at length with the patient today.  Concerns regarding medicines are outlined above.  No orders of the defined types were placed in this encounter.  No orders of the defined types were placed in this encounter.   Chief Complaint  Patient presents with  . Annual Exam  . Follow-up  . Coronary Artery Disease    History of Present Illness:    Justin Lester is a 86 y.o. male with a hx of CAD, S/P CABG, CHF, Dyslipidemia, HTN, Peripheral Vascular Disease, with EVAR of AAA  last seen 02/28/2019.  He continues to follow with vascular surgery Justin Lester January of this year carotid duplex showed mild bilateral 21 to 39% stenosis he continues to be surveilled per his EVAR without evidence of endoleak or stenosis.  Minimal figure out this Emily 10 down to 20 hospital follow-up without being in the hospital Compliance  with diet, lifestyle and medications: Yes  As always [name] is happy pleased with his wife.  He has had no intercurrent illnesses he has had his COVID-19 vaccine and has had no angina shortness of breath dyspnea palpitations syncope or falls.  His wife supervises him with his dementia he tolerates his lipid-lowering therapy without muscle pain or weakness Past Medical History:  Diagnosis Date  . Dementia ([place])   . Esophageal reflux   . Old myocardial infarct   . Prostate cancer [facility])     Past Surgical History:  Procedure Laterality Date  . ABDOMINAL AORTA STENT    . CORONARY ARTERY BYPASS GRAFT    . HERNIA REPAIR    . PROSTATE SURGERY      Current Medications: Current Meds  Medication Sig  . aspirin EC 81 MG tablet Take 81 mg by mouth every other day.   [name] atorvastatin (LIPITOR) 10 MG tablet TAKE 1 TABLET BY MOUTH  DAILY PLEASE CALL OFFICE TO SCHEDULE APPOINTMENT FOR  FURTHER REFILLS  . Cholecalciferol (VITAMIN D PO) Take 2,000 Units by mouth daily.  [name] Co-Enzyme Q-10 30 MG CAPS Take 10 mg by mouth daily.  [name] donepezil (ARICEPT) 10 MG tablet Take 10 mg by mouth daily.  [name] ibuprofen (ADVIL) 800 MG tablet Take 800 mg by mouth 3 (three) times daily.  . magnesium oxide (MAG-OX) 400 MG tablet Take 400 mg by mouth daily.  . Memantine HCl-Donepezil HCl (NAMZARIC) 28-10 MG  CP24 Take 1 capsule by mouth daily.  . metoprolol succinate (TOPROL-XL) 50 MG 24 hr tablet TAKE 1 TABLET BY MOUTH  DAILY  . Multiple Vitamin (MULTI-VITAMINS) TABS Take 1 tablet by mouth daily.  [name] NIFEdipine (PROCARDIA-XL/ADALAT-CC/NIFEDICAL-XL) 30 MG 24 hr tablet TK 1 T PO D  . ramipril (ALTACE) 2.5 MG capsule TAKE 1 CAPSULE BY MOUTH  DAILY  . traZODone (DESYREL) 50 MG tablet Take 25 mg by mouth at bedtime as needed.  . vitamin B-12 (CYANOCOBALAMIN) 1000 MCG tablet Take 1 tablet by mouth daily.  . Zinc 50 MG CAPS Take 50 mg by mouth daily.     Allergies:   Patient has no known allergies.   Social History    Socioeconomic History  . Marital status: Married    Spouse name: Not on file  . Number of children: Not on file  . Years of education: Not on file  . Highest education level: Not on file  Occupational History  . Not on file  Tobacco Use  . Smoking status: Former Research scientist (life sciences)  . Smokeless tobacco: Never Used  Vaping Use  . Vaping Use: Never used  Substance and Sexual Activity  . Alcohol use: No  . Drug use: No  . Sexual activity: Not on file  Other Topics Concern  . Not on file  Social History Narrative  . Not on file   Social Determinants of Health   Financial Resource Strain:   . Difficulty of Paying Living Expenses:   Food Insecurity:   . Worried About Charity fundraiser in the Last Year:   . Arboriculturist in the Last Year:   Transportation Needs:   . Film/video editor (Medical):   [name] Lack of Transportation (Non-Medical):   Physical Activity:   . Days of Exercise per Week:   . Minutes of Exercise per Session:   Stress:   . Feeling of Stress :   Social Connections:   . Frequency of Communication with Friends and Family:   . Frequency of Social Gatherings with Friends and Family:   . Attends Religious Services:   . Active Member of Clubs or Organizations:   . Attends Archivist Meetings:   [name] Marital Status:      Family History: The patient's family history includes CAD in his brother, mother, and sister. ROS:   Please see the history of present illness.    All other systems reviewed and are negative.  EKGs/Labs/Other Studies Reviewed:    The following studies were reviewed today:  EKG:  EKG ordered today and personally reviewed.  The ekg ordered today demonstrates sinus bradycardia 54 bpm right bundle branch block  Recent Labs: 02/28/2019: ALT 23; BUN 16; Creatinine, Ser 1.14; Potassium 4.2; Sodium 141  Recent Lipid Panel    Component Value Date/Time   CHOL 183 02/28/2019 1148   TRIG 170 (H) 02/28/2019 1148   HDL 42 02/28/2019 1148    CHOLHDL 4.4 02/28/2019 1148   LDLCALC 107 (H) 02/28/2019 1148    Physical Exam:    VS:  BP 130/74 (BP Location: Right Arm, Patient Position: Sitting, Cuff Size: Normal)   Pulse (!) 54   Ht 5\' 8"  (1.727 m)   Wt 164 lb (74.4 kg)   SpO2 98%   BMI 24.94 kg/m     Wt Readings from Last 3 Encounters:  02/14/20 164 lb (74.4 kg)  02/28/19 163 lb (73.9 kg)  06/30/18 157 lb 9.6 oz (71.5 kg)  GEN:  Well nourished, well developed in no acute distress HEENT: Normal NECK: No JVD; No carotid bruits LYMPHATICS: No lymphadenopathy CARDIAC: RRR, no murmurs, rubs, gallops RESPIRATORY:  Clear to auscultation without rales, wheezing or rhonchi  ABDOMEN: Soft, non-tender, non-distended MUSCULOSKELETAL:  No edema; No deformity  SKIN: Warm and dry NEUROLOGIC:  Alert and oriented x 3 PSYCHIATRIC:  Normal affect    Signed, Justin More, MD  02/14/2020 1:24 PM    [facility]

## 2020-02-14 ENCOUNTER — Ambulatory Visit (INDEPENDENT_AMBULATORY_CARE_PROVIDER_SITE_OTHER): Payer: Medicare Other | Admitting: Cardiology

## 2020-02-14 ENCOUNTER — Other Ambulatory Visit: Payer: Self-pay

## 2020-02-14 ENCOUNTER — Encounter: Payer: Self-pay | Admitting: Cardiology

## 2020-02-14 VITALS — BP 130/74 | HR 54 | Ht 68.0 in | Wt 164.0 lb

## 2020-02-14 DIAGNOSIS — I10 Essential (primary) hypertension: Secondary | ICD-10-CM

## 2020-02-14 DIAGNOSIS — E785 Hyperlipidemia, unspecified: Secondary | ICD-10-CM

## 2020-02-14 DIAGNOSIS — I25119 Atherosclerotic heart disease of native coronary artery with unspecified angina pectoris: Secondary | ICD-10-CM

## 2020-02-14 MED ORDER — METOPROLOL SUCCINATE ER 50 MG PO TB24: 25.0000 mg | ORAL_TABLET | Freq: Every day | ORAL | 1 refills | Status: DC

## 2020-02-14 MED ORDER — ATORVASTATIN CALCIUM 10 MG PO TABS: ORAL_TABLET | ORAL | 3 refills | Status: DC

## 2020-02-14 NOTE — Patient Instructions (Signed)
Medication Instructions:  Your physician has recommended you make the following change in your medication:  DECREASE: TOPROL XL 25 mg per 0.5 tablet by mouth daily.  *If you need a refill on your cardiac medications before your next appointment, please call your pharmacy*   Lab Work: Your physician recommends that you return for lab work in: [place], Lipids If you have labs (blood work) drawn today and your tests are completely normal, you will receive your results only by: [name] MyChart Message (if you have MyChart) OR . A paper copy in the mail If you have any lab test that is abnormal or we need to change your treatment, we will call you to review the results.   Testing/Procedures: None   Follow-Up: At [facility], you and your health needs are our priority.  As part of our continuing mission to provide you with exceptional heart care, we have created designated Provider Care Teams.  These Care Teams include your primary Cardiologist (physician) and Advanced Practice Providers (APPs -  Physician Assistants and Nurse Practitioners) who all work together to provide you with the care you need, when you need it.  We recommend signing up for the patient portal called "MyChart".  Sign up information is provided on this After Visit Summary.  MyChart is used to connect with patients for Virtual Visits (Telemedicine).  Patients are able to view lab/test results, encounter notes, upcoming appointments, etc.  Non-urgent messages can be sent to your provider as well.   To learn more about what you can do with MyChart, go to [email or web address].    Your next appointment:   1 year(s)  The format for your next appointment:   In Person  Provider:   Shirlee More, MD   Other Instructions

## 2020-02-15 ENCOUNTER — Telehealth: Payer: Self-pay

## 2020-02-15 LAB — COMPREHENSIVE METABOLIC PANEL
ALT: 19 IU/L (ref 0–44)
AST: 27 IU/L (ref 0–40)
Albumin/Globulin Ratio: 2.2 (ref 1.2–2.2)
Albumin: 4.7 g/dL — ABNORMAL HIGH (ref 3.6–4.6)
Alkaline Phosphatase: 90 IU/L (ref 48–121)
BUN/Creatinine Ratio: 15 (ref 10–24)
BUN: 21 mg/dL (ref 8–27)
Bilirubin Total: 0.7 mg/dL (ref 0.0–1.2)
CO2: 24 mmol/L (ref 20–29)
Calcium: 9.6 mg/dL (ref 8.6–10.2)
Chloride: 106 mmol/L (ref 96–106)
Creatinine, Ser: 1.36 mg/dL — ABNORMAL HIGH (ref 0.76–1.27)
GFR calc Af Amer: 54 mL/min/{1.73_m2} — ABNORMAL LOW (ref >59)
GFR calc non Af Amer: 47 mL/min/{1.73_m2} — ABNORMAL LOW (ref >59)
Globulin, Total: 2.1 g/dL (ref 1.5–4.5)
Glucose: 110 mg/dL — ABNORMAL HIGH (ref 65–99)
Potassium: 4.6 mmol/L (ref 3.5–5.2)
Sodium: 144 mmol/L (ref 134–144)
Total Protein: 6.8 g/dL (ref 6.0–8.5)

## 2020-02-15 LAB — LIPID PANEL
Chol/HDL Ratio: 3.5 ratio (ref 0.0–5.0)
Cholesterol, Total: 147 mg/dL (ref 100–199)
HDL: 42 mg/dL (ref >39)
LDL Chol Calc (NIH): 75 mg/dL (ref 0–99)
Triglycerides: 177 mg/dL — ABNORMAL HIGH (ref 0–149)
VLDL Cholesterol Cal: 30 mg/dL (ref 5–40)

## 2020-02-15 NOTE — Telephone Encounter (Signed)
Tried calling patient. No answer and no voicemail set up for me to leave a message. 

## 2020-02-15 NOTE — Telephone Encounter (Signed)
-----   Message from Brian J Munley, MD sent at 02/15/2020  7:59 AM EDT ----- Normal or stable result  No changes 

## 2020-02-16 ENCOUNTER — Telehealth: Payer: Self-pay | Admitting: Cardiology

## 2020-02-16 NOTE — Telephone Encounter (Signed)
Spoke with patient regarding results and recommendation.  Patient verbalizes understanding and is agreeable to plan of care. Advised patient to call back with any issues or concerns.  

## 2020-02-16 NOTE — Telephone Encounter (Signed)
Left message on patients voicemail to please return our call.   

## 2020-02-16 NOTE — Telephone Encounter (Signed)
Pt was returning call about lab results. Please call back

## 2020-03-06 ENCOUNTER — Other Ambulatory Visit: Payer: Self-pay | Admitting: Cardiology

## 2020-03-06 DIAGNOSIS — M549 Dorsalgia, unspecified: Secondary | ICD-10-CM | POA: Diagnosis not present

## 2020-03-07 DIAGNOSIS — G301 Alzheimer's disease with late onset: Secondary | ICD-10-CM | POA: Diagnosis not present

## 2020-03-07 DIAGNOSIS — F02818 Dementia in other diseases classified elsewhere, unspecified severity, with other behavioral disturbance: Secondary | ICD-10-CM | POA: Insufficient documentation

## 2020-03-07 DIAGNOSIS — G309 Alzheimer's disease, unspecified: Secondary | ICD-10-CM

## 2020-03-07 DIAGNOSIS — F0281 Dementia in other diseases classified elsewhere with behavioral disturbance: Secondary | ICD-10-CM | POA: Diagnosis not present

## 2020-03-07 HISTORY — DX: Alzheimer's disease, unspecified: G30.9

## 2020-03-07 HISTORY — DX: Dementia in other diseases classified elsewhere with behavioral disturbance: F02.81

## 2020-03-09 ENCOUNTER — Other Ambulatory Visit: Payer: Self-pay | Admitting: Cardiology

## 2020-03-15 DIAGNOSIS — F0281 Dementia in other diseases classified elsewhere with behavioral disturbance: Secondary | ICD-10-CM | POA: Diagnosis not present

## 2020-03-15 DIAGNOSIS — I10 Essential (primary) hypertension: Secondary | ICD-10-CM | POA: Diagnosis not present

## 2020-03-15 DIAGNOSIS — G309 Alzheimer's disease, unspecified: Secondary | ICD-10-CM | POA: Diagnosis not present

## 2020-03-20 DIAGNOSIS — G309 Alzheimer's disease, unspecified: Secondary | ICD-10-CM | POA: Diagnosis not present

## 2020-03-20 DIAGNOSIS — F0281 Dementia in other diseases classified elsewhere with behavioral disturbance: Secondary | ICD-10-CM | POA: Diagnosis not present

## 2020-03-20 DIAGNOSIS — I10 Essential (primary) hypertension: Secondary | ICD-10-CM | POA: Diagnosis not present

## 2020-03-21 DIAGNOSIS — F0281 Dementia in other diseases classified elsewhere with behavioral disturbance: Secondary | ICD-10-CM | POA: Diagnosis not present

## 2020-03-21 DIAGNOSIS — I10 Essential (primary) hypertension: Secondary | ICD-10-CM | POA: Diagnosis not present

## 2020-03-21 DIAGNOSIS — G309 Alzheimer's disease, unspecified: Secondary | ICD-10-CM | POA: Diagnosis not present

## 2020-03-22 DIAGNOSIS — F0281 Dementia in other diseases classified elsewhere with behavioral disturbance: Secondary | ICD-10-CM | POA: Diagnosis not present

## 2020-03-22 DIAGNOSIS — I10 Essential (primary) hypertension: Secondary | ICD-10-CM | POA: Diagnosis not present

## 2020-03-22 DIAGNOSIS — G309 Alzheimer's disease, unspecified: Secondary | ICD-10-CM | POA: Diagnosis not present

## 2020-03-24 ENCOUNTER — Encounter (HOSPITAL_COMMUNITY): Payer: Self-pay | Admitting: Emergency Medicine

## 2020-03-24 ENCOUNTER — Emergency Department (HOSPITAL_COMMUNITY)
Admission: EM | Admit: 2020-03-24 | Discharge: 2020-03-24 | Disposition: A | Discharge status: Home / Self Care | Payer: Medicare Other | Source: Home / Self Care | Attending: Emergency Medicine | Admitting: Emergency Medicine

## 2020-03-24 ENCOUNTER — Emergency Department (HOSPITAL_COMMUNITY): Payer: Medicare Other

## 2020-03-24 ENCOUNTER — Other Ambulatory Visit: Payer: Self-pay

## 2020-03-24 DIAGNOSIS — R509 Fever, unspecified: Secondary | ICD-10-CM

## 2020-03-24 DIAGNOSIS — Z20822 Contact with and (suspected) exposure to covid-19: Secondary | ICD-10-CM | POA: Insufficient documentation

## 2020-03-24 DIAGNOSIS — D72829 Elevated white blood cell count, unspecified: Secondary | ICD-10-CM | POA: Insufficient documentation

## 2020-03-24 DIAGNOSIS — I10 Essential (primary) hypertension: Secondary | ICD-10-CM | POA: Diagnosis not present

## 2020-03-24 DIAGNOSIS — F0390 Unspecified dementia without behavioral disturbance: Secondary | ICD-10-CM | POA: Insufficient documentation

## 2020-03-24 DIAGNOSIS — M25511 Pain in right shoulder: Secondary | ICD-10-CM | POA: Diagnosis not present

## 2020-03-24 DIAGNOSIS — R7881 Bacteremia: Secondary | ICD-10-CM | POA: Diagnosis not present

## 2020-03-24 DIAGNOSIS — Z87891 Personal history of nicotine dependence: Secondary | ICD-10-CM | POA: Insufficient documentation

## 2020-03-24 DIAGNOSIS — R410 Disorientation, unspecified: Secondary | ICD-10-CM | POA: Insufficient documentation

## 2020-03-24 DIAGNOSIS — R0902 Hypoxemia: Secondary | ICD-10-CM | POA: Diagnosis not present

## 2020-03-24 DIAGNOSIS — E872 Acidosis: Secondary | ICD-10-CM | POA: Diagnosis not present

## 2020-03-24 LAB — URINALYSIS, ROUTINE W REFLEX MICROSCOPIC
Bilirubin Urine: NEGATIVE
Glucose, UA: NEGATIVE mg/dL
Hgb urine dipstick: NEGATIVE
Ketones, ur: 20 mg/dL — AB
Leukocytes,Ua: NEGATIVE
Nitrite: NEGATIVE
Protein, ur: NEGATIVE mg/dL
Specific Gravity, Urine: 1.023 (ref 1.005–1.030)
pH: 5 (ref 5.0–8.0)

## 2020-03-24 LAB — CBC WITH DIFFERENTIAL/PLATELET
Abs Immature Granulocytes: 0.05 10*3/uL (ref 0.00–0.07)
Basophils Absolute: 0 10*3/uL (ref 0.0–0.1)
Basophils Relative: 0 %
Eosinophils Absolute: 0.1 10*3/uL (ref 0.0–0.5)
Eosinophils Relative: 1 %
HCT: 46.3 % (ref 39.0–52.0)
Hemoglobin: 15.5 g/dL (ref 13.0–17.0)
Immature Granulocytes: 0 %
Lymphocytes Relative: 4 %
Lymphs Abs: 0.5 10*3/uL — ABNORMAL LOW (ref 0.7–4.0)
MCH: 30.8 pg (ref 26.0–34.0)
MCHC: 33.5 g/dL (ref 30.0–36.0)
MCV: 91.9 fL (ref 80.0–100.0)
Monocytes Absolute: 0.2 10*3/uL (ref 0.1–1.0)
Monocytes Relative: 1 %
Neutro Abs: 13 10*3/uL — ABNORMAL HIGH (ref 1.7–7.7)
Neutrophils Relative %: 94 %
Platelets: 165 10*3/uL (ref 150–400)
RBC: 5.04 MIL/uL (ref 4.22–5.81)
RDW: 12.7 % (ref 11.5–15.5)
WBC: 13.9 10*3/uL — ABNORMAL HIGH (ref 4.0–10.5)
nRBC: 0 % (ref 0.0–0.2)

## 2020-03-24 LAB — COMPREHENSIVE METABOLIC PANEL
ALT: 19 U/L (ref 0–44)
AST: 23 U/L (ref 15–41)
Albumin: 4.1 g/dL (ref 3.5–5.0)
Alkaline Phosphatase: 63 U/L (ref 38–126)
Anion gap: 13 (ref 5–15)
BUN: 20 mg/dL (ref 8–23)
CO2: 21 mmol/L — ABNORMAL LOW (ref 22–32)
Calcium: 9.1 mg/dL (ref 8.9–10.3)
Chloride: 110 mmol/L (ref 98–111)
Creatinine, Ser: 1.19 mg/dL (ref 0.61–1.24)
GFR calc Af Amer: >60 mL/min (ref >60)
GFR calc non Af Amer: 55 mL/min — ABNORMAL LOW (ref >60)
Glucose, Bld: 146 mg/dL — ABNORMAL HIGH (ref 70–99)
Potassium: 3.9 mmol/L (ref 3.5–5.1)
Sodium: 144 mmol/L (ref 135–145)
Total Bilirubin: 1.3 mg/dL — ABNORMAL HIGH (ref 0.3–1.2)
Total Protein: 6.4 g/dL — ABNORMAL LOW (ref 6.5–8.1)

## 2020-03-24 LAB — PROTIME-INR
INR: 1.1 (ref 0.8–1.2)
Prothrombin Time: 14.1 seconds (ref 11.4–15.2)

## 2020-03-24 LAB — SARS CORONAVIRUS 2 BY RT PCR (HOSPITAL ORDER, PERFORMED IN ~~LOC~~ HOSPITAL LAB): SARS Coronavirus 2: NEGATIVE

## 2020-03-24 LAB — LACTIC ACID, PLASMA
Lactic Acid, Venous: 2 mmol/L (ref 0.5–1.9)
Lactic Acid, Venous: 2.3 mmol/L (ref 0.5–1.9)

## 2020-03-24 MED ORDER — SODIUM CHLORIDE 0.9 % IV SOLN
1.0000 g | Freq: Once | INTRAVENOUS | Status: AC
  Administered 2020-03-24: 1 g via INTRAVENOUS
  Filled 2020-03-24: qty 10

## 2020-03-24 MED ORDER — SODIUM CHLORIDE 0.9 % IV BOLUS
1000.0000 mL | Freq: Once | INTRAVENOUS | Status: AC
  Administered 2020-03-24: 1000 mL via INTRAVENOUS

## 2020-03-24 MED ORDER — LACTATED RINGERS IV BOLUS
1000.0000 mL | Freq: Once | INTRAVENOUS | Status: AC
  Administered 2020-03-24: 1000 mL via INTRAVENOUS

## 2020-03-24 MED ORDER — LACTATED RINGERS IV BOLUS: 1000.0000 mL | Freq: Once | INTRAVENOUS | Status: DC

## 2020-03-24 NOTE — ED Provider Notes (Signed)
Transfer of Care Note I assumed care of [facility] [place] on 03/24/2020 at 1530.  Briefly, Shanta Dorvil is a 86 y.o. male who:  Has a h/o dementia who p/w fever.    Temp 103 at home a/w shaking.  AF in ED  Pt A-sx and pleasantly confused.    Lactate 2.0.  WC 13.9.  BCx sent.  UA/COVID pending.  Reeval after 1L fluids and repeat lactate.  The plan includes:  F/u labs after fluids and reassess   Please refer to the original provider's note for additional information regarding the care of [facility].  Reassessment: Vital Signs:  The most current vitals were  Vitals:   03/24/20 1446 03/24/20 1500  BP:    Pulse:  84  Resp:  18  Temp: 98.9 F (37.2 C)   SpO2:  92%    Hemodynamics:  The patient is hemodynamically stable. Mental Status:  The patient is Alert  Additional MDM: Repeat lactic 2.3.  COVID neg.  CXR clear.  No evidence of UTI on UA.  Spoke w/ pt's wife who stated that pt had approx 77m of generalized shaking and feeling very cold this am w/o AMS or LOC.  No sz hx.  Pt currently a-sx and overall appears well.  Does not appear epileptic in nature given no AMS, LOC and length of episode w/o priors.  Overall pt appears well and feel he is stable for discharge.  Will given pt dose of Rocephin prior to d/c given h/o fever and additional liter of fluids.  Pt's wife is currently on the way to the ED w/ her son to pick the pt up.  He has a neuro appt on 9/8.  Patient's wife arrived and I spoke with her at the bedside.  Patient's wife states that he appears back at his baseline patient is currently denying any other symptoms.  Patient is gone 2 L of fluids as well as Rocephin.  Feel the patient is stable for discharge at this time.  I discussed this plan with the patient's wife who stated that she felt comfortable taking the patient home and caring for him.  I gave her strict ED return precautions and encouraged her to follow-up with neurology given the history of shaking though I feel  this was more likely due to this rigors than seizures.  Patient's wife voiced agreement and understanding of the overall plan.  Patient was discharged in stable condition without further events.   Silvestre Gunner, MD 03/24/20 1944    Dorie Rank, MD 03/25/20 1745

## 2020-03-24 NOTE — ED Provider Notes (Signed)
I saw and evaluated the patient, reviewed the resident's note and I agree with the findings and plan.  Pertinent History: This patient is a 86 year old male, history of dementia, the patient had some fever with a measurement of 103 at home, paramedics gave ibuprofen this morning at around 8:50 AM.  He had IV access obtained prehospital, the patient is unable to give any further history due to dementia, level 5 caveat applies  Pertinent Exam findings: Well-appearing, vital signs are reassuring, fever seems to to have defervesced, heart rate in the 90s blood pressure 105/66.  The patient does have a lactic acid of 2.0, soft abdomen, nontender, normal-appearing penis, normal-appearing skin without rash, soft abdomen, clear heart and lung sounds without rales, clear oropharynx, no sources of infection on exam  I was personally present and directly supervised the following procedures:  Medical evaluation  Test for Covid, test for urinalysis for urinary tract infection, x-ray and labs evaluated and no obvious source of infection seen at this point  Justin Lester was evaluated in Emergency Department on 03/25/2020 for the symptoms described in the history of present illness. He was evaluated in the context of the global COVID-19 pandemic, which necessitated consideration that the patient might be at risk for infection with the SARS-CoV-2 virus that causes COVID-19. Institutional protocols and algorithms that pertain to the evaluation of patients at risk for COVID-19 are in a state of rapid change based on information released by regulatory bodies including the CDC and federal and state organizations. These policies and algorithms were followed during the patient's care in the ED.   I personally interpreted the EKG as well as the resident and agree with the interpretation on the resident's chart.  Final diagnoses:  Fever, unspecified fever cause      Noemi Chapel, MD 03/25/20 [phone]

## 2020-03-24 NOTE — ED Triage Notes (Signed)
Pt to triage via [facility] from home.  Wife called EMS because pt shaking.  Pt had fever 103.  EMS administered Ibuprofen 800 mg at 0850.  20 g IV LFA.  Pt denies pain.

## 2020-03-24 NOTE — ED Notes (Signed)
Patient verbalizes understanding of discharge instructions. Opportunity for questioning and answers were provided. Armband removed by staff, pt discharged from ED to home with wife 

## 2020-03-24 NOTE — Discharge Instructions (Signed)
Please take Tylenol Motrin as needed at home for fever and please follow-up with your primary care provider as well as neurology if symptoms continue.

## 2020-03-24 NOTE — ED Notes (Signed)
Pt up out of bed without assistance, confused. Assisted back to bed, posey alarm applied to stretcher.

## 2020-03-24 NOTE — ED Provider Notes (Signed)
[facility] EMERGENCY DEPARTMENT Provider Note   CSN: [id number] Arrival date & time: 03/24/20  4098     History Chief Complaint  Patient presents with  . Fever    Justin Lester is a 86 y.o. male.  HPI   Patient presents to the emergency department via EMS from home.  The patient was reportedly febrile this morning and shaking per the wife.  Febrile to 103 at home.  EMS gave ibuprofen 800 mg around 8:50 AM.  There able to obtain IV access prior to arrival.  Unable to obtain additional information from the patient due to severe underlying dementia.  I attempted to contact the wife for collateral information however was unable to reach her by both home phone and mobile phone.     Past Medical History:  Diagnosis Date  . Dementia ([place])   . Esophageal reflux   . Old myocardial infarct   . Prostate cancer [facility])     Patient Active Problem List   Diagnosis Date Noted  . Continuous leakage of urine 06/10/2019  . SDAT (senile dementia of Alzheimer's type) ([place]) 06/10/2019  . Bilateral bunions 11/24/2018  . Metatarsalgia of both feet 11/24/2018  . Pre-ulcerative calluses 11/24/2018  . Toenail fungus 11/24/2018  . H/O aortic aneurysm repair 08/05/2018  . Bilateral carotid artery stenosis 07/14/2016  . Abdominal aortic aneurysm without rupture ([place]) 07/14/2016  . Coronary artery disease involving native coronary artery of native heart with angina pectoris ([place]) 04/26/2015  . Essential hypertension 04/26/2015  . Hyperlipidemia 04/26/2015  . RBBB 04/26/2015  . Ruptured abdominal aortic aneurysm ([place]) 04/26/2015    Past Surgical History:  Procedure Laterality Date  . ABDOMINAL AORTA STENT    . CORONARY ARTERY BYPASS GRAFT    . HERNIA REPAIR    . PROSTATE SURGERY         Family History  Problem Relation Age of Onset  . CAD Mother   . CAD Sister   . CAD Brother     Social History   Tobacco Use  . Smoking status: Former Research scientist (life sciences)  . Smokeless  tobacco: Never Used  Vaping Use  . Vaping Use: Never used  Substance Use Topics  . Alcohol use: No  . Drug use: No    Home Medications Prior to Admission medications   Medication Sig Start Date End Date Taking? Authorizing Provider  aspirin EC 81 MG tablet Take 81 mg by mouth every other day.     [provider]  atorvastatin (LIPITOR) 10 MG tablet TAKE 1 TABLET BY MOUTH  DAILY 03/06/20   Richardo Priest, MD  Cholecalciferol (VITAMIN D PO) Take 2,000 Units by mouth daily.    [provider]  Co-Enzyme Q-10 30 MG CAPS Take 10 mg by mouth daily.    [provider]  donepezil (ARICEPT) 10 MG tablet Take 10 mg by mouth daily. 05/12/18   [provider]  ibuprofen (ADVIL) 800 MG tablet Take 800 mg by mouth 3 (three) times daily. 01/19/20   [provider]  magnesium oxide (MAG-OX) 400 MG tablet Take 400 mg by mouth daily.    [provider]  Memantine HCl-Donepezil HCl (NAMZARIC) 28-10 MG CP24 Take 1 capsule by mouth daily.    [provider]  metoprolol succinate (TOPROL-XL) 50 MG 24 hr tablet Take 0.5 tablets (25 mg total) by mouth daily. Take with or immediately following a meal. 02/14/20   Munley, Hilton Cork, MD  Multiple Vitamin (MULTI-VITAMINS) TABS Take 1  tablet by mouth daily.    [provider]  NIFEdipine (PROCARDIA-XL/ADALAT-CC/NIFEDICAL-XL) 30 MG 24 hr tablet TK 1 T PO D 07/21/17   [provider]  ramipril (ALTACE) 2.5 MG capsule TAKE 1 CAPSULE BY MOUTH  DAILY 03/06/20   Richardo Priest, MD  traZODone (DESYREL) 50 MG tablet Take 25 mg by mouth at bedtime as needed. 02/13/20   [provider]  vitamin B-12 (CYANOCOBALAMIN) 1000 MCG tablet Take 1 tablet by mouth daily.    [provider]  Zinc 50 MG CAPS Take 50 mg by mouth daily.    [provider]    Allergies    Patient has no known allergies.  Review of Systems   Review of Systems  Unable to perform ROS: Dementia    Physical  Exam Updated Vital Signs BP 104/66 (BP Location: Right Arm)   Pulse 84   Temp 98.9 F (37.2 C) (Oral)   Resp 18   SpO2 92%   Physical Exam Vitals and nursing note reviewed.  Constitutional:      General: He is not in acute distress.    Appearance: Normal appearance. He is well-developed and normal weight. He is not ill-appearing or toxic-appearing.  HENT:     Head: Normocephalic and atraumatic.  Eyes:     Conjunctiva/sclera: Conjunctivae normal.  Cardiovascular:     Rate and Rhythm: Normal rate and regular rhythm.     Pulses: Normal pulses.     Heart sounds: No murmur heard.   Pulmonary:     Effort: Pulmonary effort is normal. No respiratory distress.     Breath sounds: Normal breath sounds.  Abdominal:     General: There is no distension.     Palpations: Abdomen is soft.     Tenderness: There is no abdominal tenderness.  Musculoskeletal:     Cervical back: Neck supple.  Skin:    General: Skin is warm and dry.  Neurological:     General: No focal deficit present.     Mental Status: He is alert. Mental status is at baseline. He is confused.  Psychiatric:        Mood and Affect: Mood normal.        Behavior: Behavior normal.     ED Results / Procedures / Treatments   Labs (all labs ordered are listed, but only abnormal results are displayed) Labs Reviewed  LACTIC ACID, PLASMA - Abnormal; Notable for the following components:      Result Value   Lactic Acid, Venous 2.0 (*)    All other components within normal limits  COMPREHENSIVE METABOLIC PANEL - Abnormal; Notable for the following components:   CO2 21 (*)    Glucose, Bld 146 (*)    Total Protein 6.4 (*)    Total Bilirubin 1.3 (*)    GFR calc non Af Amer 55 (*)    All other components within normal limits  CBC WITH DIFFERENTIAL/PLATELET - Abnormal; Notable for the following components:   WBC 13.9 (*)    Neutro Abs 13.0 (*)    Lymphs Abs 0.5 (*)    All other components within normal limits  SARS  CORONAVIRUS 2 BY RT PCR (HOSPITAL ORDER, [facility])  CULTURE, BLOOD (ROUTINE X 2)  CULTURE, BLOOD (ROUTINE X 2)  URINE CULTURE  PROTIME-INR  LACTIC ACID, PLASMA  URINALYSIS, ROUTINE W REFLEX MICROSCOPIC    EKG None  Radiology DG Chest 2 View  Result Date: 03/24/2020 CLINICAL DATA:  Fever. EXAM: CHEST - 2 VIEW COMPARISON:  September 03, 2015 FINDINGS: The heart size and mediastinal contours are within normal limits. Both lungs are clear. The visualized skeletal structures are unremarkable. IMPRESSION: No active cardiopulmonary disease. Electronically Signed   By: Dorise Bullion III M.D   On: 03/24/2020 10:29    Procedures Procedures (including critical care time)  Medications Ordered in ED Medications  lactated ringers bolus 1,000 mL (1,000 mLs Intravenous New Bag/Given 03/24/20 1449)    ED Course   Justin Lester is a 86 y.o. male with PMHx listed that presents to the Emergency Department complaint of Fever    ED Course: Initial exam completed.   Well-appearing and hemodynamically stable.  Nontoxic and afebrile.  Physical exam significant for pleasantly demented 86 year old male without obvious complaints, abdomen soft, nondistended, nontender, no evidence of external traumatic injury, extremities perfused, and abdomen soft, nondistended, benign.  Initial differential includes multiple sources of sepsis including urine/pneumonia, viral pneumonia, electrolyte abnormality/dehydration, and viral illness.  Triage work-up reviewed.   Initial lactic acid 2.0.  INR normal.  CMP without acute electrolyte abnormalities requiring urgent intervention.  CBC with nonspecific leukocytosis 13.9 and stable hemoglobin.  CXR without evidence of active cardiopulmonary disease.  Given the limited HPI information available, additional sepsis orders placed including blood cultures and COVID-19 testing.  1 L LR bolus for fluid resuscitation.   Diagnostics Vital Signs:  reviewed Labs: reviewed and significant findings discussed above Imaging: personally reviewed images interpreted by radiology Records: nursing notes along with previous records reviewed and pertinent data discussed   Consults:  none   Reevaluation/Disposition:  Care was transitioned to oncoming provider pending urinalysis and repeat lactic acid.  If patient maintains his clinical examination and lactic acid improves after fluid administration without identifiable source, likely okay to discharge home with appropriate follow-up.    Sherolyn Buba, MD Emergency Medicine, PGY-3   Note: Dragon medical dictation software was used in the creation of this note.   Final Clinical Impression(s) / ED Diagnoses Final diagnoses:  None    Rx / DC Orders ED Discharge Orders    None       Frann Rider, MD 03/25/20 5188    Noemi Chapel, MD 03/25/20 [phone]

## 2020-03-25 ENCOUNTER — Other Ambulatory Visit: Payer: Self-pay

## 2020-03-25 ENCOUNTER — Telehealth (HOSPITAL_BASED_OUTPATIENT_CLINIC_OR_DEPARTMENT_OTHER): Payer: Self-pay | Admitting: *Deleted

## 2020-03-25 ENCOUNTER — Inpatient Hospital Stay (HOSPITAL_COMMUNITY)
Admission: EM | Admit: 2020-03-25 | Discharge: 2020-03-28 | DRG: 872 | Disposition: A | Discharge status: Home Health | Payer: Medicare Other | Attending: Internal Medicine | Admitting: Internal Medicine

## 2020-03-25 ENCOUNTER — Emergency Department (HOSPITAL_COMMUNITY): Payer: Medicare Other

## 2020-03-25 ENCOUNTER — Encounter (HOSPITAL_COMMUNITY): Payer: Self-pay | Admitting: Student

## 2020-03-25 DIAGNOSIS — B951 Streptococcus, group B, as the cause of diseases classified elsewhere: Secondary | ICD-10-CM | POA: Diagnosis present

## 2020-03-25 DIAGNOSIS — F0390 Unspecified dementia without behavioral disturbance: Secondary | ICD-10-CM | POA: Diagnosis not present

## 2020-03-25 DIAGNOSIS — Z951 Presence of aortocoronary bypass graft: Secondary | ICD-10-CM

## 2020-03-25 DIAGNOSIS — I25119 Atherosclerotic heart disease of native coronary artery with unspecified angina pectoris: Secondary | ICD-10-CM | POA: Diagnosis present

## 2020-03-25 DIAGNOSIS — R32 Unspecified urinary incontinence: Secondary | ICD-10-CM | POA: Diagnosis not present

## 2020-03-25 DIAGNOSIS — M549 Dorsalgia, unspecified: Secondary | ICD-10-CM | POA: Diagnosis present

## 2020-03-25 DIAGNOSIS — M25511 Pain in right shoulder: Secondary | ICD-10-CM | POA: Diagnosis present

## 2020-03-25 DIAGNOSIS — Z20822 Contact with and (suspected) exposure to covid-19: Secondary | ICD-10-CM | POA: Diagnosis present

## 2020-03-25 DIAGNOSIS — D72829 Elevated white blood cell count, unspecified: Secondary | ICD-10-CM | POA: Diagnosis present

## 2020-03-25 DIAGNOSIS — B955 Unspecified streptococcus as the cause of diseases classified elsewhere: Secondary | ICD-10-CM | POA: Diagnosis not present

## 2020-03-25 DIAGNOSIS — Z8546 Personal history of malignant neoplasm of prostate: Secondary | ICD-10-CM | POA: Diagnosis not present

## 2020-03-25 DIAGNOSIS — Z66 Do not resuscitate: Secondary | ICD-10-CM | POA: Diagnosis not present

## 2020-03-25 DIAGNOSIS — I10 Essential (primary) hypertension: Secondary | ICD-10-CM | POA: Diagnosis present

## 2020-03-25 DIAGNOSIS — R7881 Bacteremia: Principal | ICD-10-CM | POA: Diagnosis present

## 2020-03-25 DIAGNOSIS — Z7982 Long term (current) use of aspirin: Secondary | ICD-10-CM | POA: Diagnosis not present

## 2020-03-25 DIAGNOSIS — M5124 Other intervertebral disc displacement, thoracic region: Secondary | ICD-10-CM | POA: Diagnosis not present

## 2020-03-25 DIAGNOSIS — R600 Localized edema: Secondary | ICD-10-CM | POA: Diagnosis not present

## 2020-03-25 DIAGNOSIS — Z8249 Family history of ischemic heart disease and other diseases of the circulatory system: Secondary | ICD-10-CM

## 2020-03-25 DIAGNOSIS — G309 Alzheimer's disease, unspecified: Secondary | ICD-10-CM | POA: Diagnosis present

## 2020-03-25 DIAGNOSIS — F0280 Dementia in other diseases classified elsewhere without behavioral disturbance: Secondary | ICD-10-CM | POA: Diagnosis present

## 2020-03-25 DIAGNOSIS — M19011 Primary osteoarthritis, right shoulder: Secondary | ICD-10-CM | POA: Diagnosis present

## 2020-03-25 DIAGNOSIS — D72825 Bandemia: Secondary | ICD-10-CM

## 2020-03-25 DIAGNOSIS — R509 Fever, unspecified: Secondary | ICD-10-CM | POA: Diagnosis present

## 2020-03-25 DIAGNOSIS — Z87891 Personal history of nicotine dependence: Secondary | ICD-10-CM

## 2020-03-25 DIAGNOSIS — I252 Old myocardial infarction: Secondary | ICD-10-CM

## 2020-03-25 DIAGNOSIS — M47816 Spondylosis without myelopathy or radiculopathy, lumbar region: Secondary | ICD-10-CM | POA: Diagnosis not present

## 2020-03-25 DIAGNOSIS — I714 Abdominal aortic aneurysm, without rupture: Secondary | ICD-10-CM | POA: Diagnosis present

## 2020-03-25 DIAGNOSIS — Z79899 Other long term (current) drug therapy: Secondary | ICD-10-CM | POA: Diagnosis not present

## 2020-03-25 DIAGNOSIS — E872 Acidosis: Secondary | ICD-10-CM | POA: Diagnosis present

## 2020-03-25 DIAGNOSIS — M47817 Spondylosis without myelopathy or radiculopathy, lumbosacral region: Secondary | ICD-10-CM | POA: Diagnosis not present

## 2020-03-25 DIAGNOSIS — M5127 Other intervertebral disc displacement, lumbosacral region: Secondary | ICD-10-CM | POA: Diagnosis not present

## 2020-03-25 DIAGNOSIS — M4697 Unspecified inflammatory spondylopathy, lumbosacral region: Secondary | ICD-10-CM | POA: Diagnosis not present

## 2020-03-25 HISTORY — DX: Unspecified streptococcus as the cause of diseases classified elsewhere: R78.81

## 2020-03-25 HISTORY — DX: Bacteremia: B95.5

## 2020-03-25 LAB — BLOOD CULTURE ID PANEL (REFLEXED) - BCID2

## 2020-03-25 LAB — CBC
HCT: 45.2 % (ref 39.0–52.0)
Hemoglobin: 14.9 g/dL (ref 13.0–17.0)
MCH: 31.2 pg (ref 26.0–34.0)
MCHC: 33 g/dL (ref 30.0–36.0)
MCV: 94.8 fL (ref 80.0–100.0)
Platelets: 132 10*3/uL — ABNORMAL LOW (ref 150–400)
RBC: 4.77 MIL/uL (ref 4.22–5.81)
RDW: 13.4 % (ref 11.5–15.5)
WBC: 17.6 10*3/uL — ABNORMAL HIGH (ref 4.0–10.5)
nRBC: 0 % (ref 0.0–0.2)

## 2020-03-25 LAB — URINALYSIS, ROUTINE W REFLEX MICROSCOPIC
Bacteria, UA: NONE SEEN
Bilirubin Urine: NEGATIVE
Glucose, UA: NEGATIVE mg/dL
Hgb urine dipstick: NEGATIVE
Ketones, ur: 20 mg/dL — AB
Leukocytes,Ua: NEGATIVE
Nitrite: NEGATIVE
Protein, ur: 30 mg/dL — AB
Specific Gravity, Urine: 1.024 (ref 1.005–1.030)
pH: 5 (ref 5.0–8.0)

## 2020-03-25 LAB — COMPREHENSIVE METABOLIC PANEL
ALT: 19 U/L (ref 0–44)
AST: 31 U/L (ref 15–41)
Albumin: 4 g/dL (ref 3.5–5.0)
Alkaline Phosphatase: 54 U/L (ref 38–126)
Anion gap: 9 (ref 5–15)
BUN: 20 mg/dL (ref 8–23)
CO2: 24 mmol/L (ref 22–32)
Calcium: 8.9 mg/dL (ref 8.9–10.3)
Chloride: 108 mmol/L (ref 98–111)
Creatinine, Ser: 1.11 mg/dL (ref 0.61–1.24)
GFR calc Af Amer: >60 mL/min (ref >60)
GFR calc non Af Amer: 59 mL/min — ABNORMAL LOW (ref >60)
Glucose, Bld: 132 mg/dL — ABNORMAL HIGH (ref 70–99)
Potassium: 4.2 mmol/L (ref 3.5–5.1)
Sodium: 141 mmol/L (ref 135–145)
Total Bilirubin: 1 mg/dL (ref 0.3–1.2)
Total Protein: 6.6 g/dL (ref 6.5–8.1)

## 2020-03-25 LAB — LACTIC ACID, PLASMA
Lactic Acid, Venous: 2.1 mmol/L (ref 0.5–1.9)
Lactic Acid, Venous: 1.7 mmol/L (ref 0.5–1.9)

## 2020-03-25 LAB — SARS CORONAVIRUS 2 BY RT PCR (HOSPITAL ORDER, PERFORMED IN ~~LOC~~ HOSPITAL LAB): SARS Coronavirus 2: NEGATIVE

## 2020-03-25 MED ORDER — SODIUM CHLORIDE 0.9 % IV SOLN
1.0000 g | Freq: Once | INTRAVENOUS | Status: AC
  Administered 2020-03-25: 1 g via INTRAVENOUS
  Filled 2020-03-25: qty 10

## 2020-03-25 MED ORDER — ONDANSETRON HCL 4 MG/2ML IJ SOLN: 4.0000 mg | Freq: Four times a day (QID) | INTRAMUSCULAR | Status: DC | PRN

## 2020-03-25 MED ORDER — ENOXAPARIN SODIUM 40 MG/0.4ML ~~LOC~~ SOLN
40.0000 mg | SUBCUTANEOUS | Status: DC
  Administered 2020-03-25 – 2020-03-27 (×3): 40 mg via SUBCUTANEOUS
  Filled 2020-03-25 (×3): qty 0.4

## 2020-03-25 MED ORDER — SENNOSIDES-DOCUSATE SODIUM 8.6-50 MG PO TABS: 1.0000 | ORAL_TABLET | Freq: Every evening | ORAL | Status: DC | PRN

## 2020-03-25 MED ORDER — MEMANTINE HCL-DONEPEZIL HCL ER 28-10 MG PO CP24: 1.0000 | ORAL_CAPSULE | Freq: Every day | ORAL | Status: DC

## 2020-03-25 MED ORDER — TRAZODONE HCL 50 MG PO TABS
50.0000 mg | ORAL_TABLET | Freq: Every day | ORAL | Status: DC
  Administered 2020-03-25 – 2020-03-27 (×3): 50 mg via ORAL
  Filled 2020-03-25 (×3): qty 1

## 2020-03-25 MED ORDER — ACETAMINOPHEN 325 MG PO TABS: 650.0000 mg | ORAL_TABLET | Freq: Four times a day (QID) | ORAL | Status: DC | PRN

## 2020-03-25 MED ORDER — BISACODYL 10 MG RE SUPP: 10.0000 mg | Freq: Every day | RECTAL | Status: DC | PRN

## 2020-03-25 MED ORDER — MEMANTINE HCL ER 28 MG PO CP24
28.0000 mg | ORAL_CAPSULE | Freq: Every day | ORAL | Status: DC
  Filled 2020-03-25: qty 1

## 2020-03-25 MED ORDER — METOPROLOL SUCCINATE ER 25 MG PO TB24
25.0000 mg | ORAL_TABLET | Freq: Every day | ORAL | Status: DC
  Administered 2020-03-25 – 2020-03-28 (×4): 25 mg via ORAL
  Filled 2020-03-25 (×4): qty 1

## 2020-03-25 MED ORDER — DICLOFENAC SODIUM 1 % EX GEL
2.0000 g | Freq: Four times a day (QID) | CUTANEOUS | Status: DC | PRN
  Filled 2020-03-25: qty 100

## 2020-03-25 MED ORDER — OXYCODONE HCL 5 MG PO TABS
5.0000 mg | ORAL_TABLET | Freq: Three times a day (TID) | ORAL | Status: DC | PRN
  Administered 2020-03-27 – 2020-03-28 (×2): 5 mg via ORAL
  Filled 2020-03-25 (×2): qty 1

## 2020-03-25 MED ORDER — PENICILLIN G POTASSIUM 20000000 UNITS IJ SOLR
4.0000 10*6.[IU] | Freq: Four times a day (QID) | INTRAVENOUS | Status: AC
  Administered 2020-03-25 – 2020-03-26 (×4): 4 10*6.[IU] via INTRAVENOUS
  Filled 2020-03-25 (×4): qty 4

## 2020-03-25 MED ORDER — ASPIRIN EC 81 MG PO TBEC
81.0000 mg | DELAYED_RELEASE_TABLET | ORAL | Status: DC
  Administered 2020-03-26 – 2020-03-28 (×2): 81 mg via ORAL
  Filled 2020-03-25 (×2): qty 1

## 2020-03-25 MED ORDER — ONDANSETRON HCL 4 MG PO TABS: 4.0000 mg | ORAL_TABLET | Freq: Four times a day (QID) | ORAL | Status: DC | PRN

## 2020-03-25 MED ORDER — RAMIPRIL 2.5 MG PO CAPS
2.5000 mg | ORAL_CAPSULE | Freq: Every day | ORAL | Status: DC
  Administered 2020-03-25 – 2020-03-28 (×4): 2.5 mg via ORAL
  Filled 2020-03-25 (×4): qty 1

## 2020-03-25 MED ORDER — ATORVASTATIN CALCIUM 10 MG PO TABS
10.0000 mg | ORAL_TABLET | Freq: Every day | ORAL | Status: DC
  Administered 2020-03-25 – 2020-03-28 (×4): 10 mg via ORAL
  Filled 2020-03-25 (×4): qty 1

## 2020-03-25 MED ORDER — VITAMIN D 25 MCG (1000 UNIT) PO TABS
2000.0000 [IU] | ORAL_TABLET | Freq: Every day | ORAL | Status: DC
  Administered 2020-03-25 – 2020-03-28 (×4): 2000 [IU] via ORAL
  Filled 2020-03-25 (×4): qty 2

## 2020-03-25 MED ORDER — VANCOMYCIN HCL 1500 MG/300ML IV SOLN
1500.0000 mg | Freq: Once | INTRAVENOUS | Status: AC
  Administered 2020-03-25: 1500 mg via INTRAVENOUS
  Filled 2020-03-25: qty 300

## 2020-03-25 MED ORDER — ADULT MULTIVITAMIN W/MINERALS CH
1.0000 | ORAL_TABLET | Freq: Every day | ORAL | Status: DC
  Administered 2020-03-25 – 2020-03-28 (×4): 1 via ORAL
  Filled 2020-03-25 (×4): qty 1

## 2020-03-25 MED ORDER — ACETAMINOPHEN 650 MG RE SUPP: 650.0000 mg | Freq: Four times a day (QID) | RECTAL | Status: DC | PRN

## 2020-03-25 MED ORDER — SODIUM CHLORIDE 0.9 % IV BOLUS
1000.0000 mL | Freq: Once | INTRAVENOUS | Status: AC
  Administered 2020-03-25: 1000 mL via INTRAVENOUS

## 2020-03-25 MED ORDER — VITAMIN B-12 1000 MCG PO TABS
1000.0000 ug | ORAL_TABLET | Freq: Every day | ORAL | Status: DC
  Administered 2020-03-25 – 2020-03-28 (×4): 1000 ug via ORAL
  Filled 2020-03-25 (×5): qty 1

## 2020-03-25 MED ORDER — DOCUSATE SODIUM 100 MG PO CAPS
100.0000 mg | ORAL_CAPSULE | Freq: Two times a day (BID) | ORAL | Status: DC
  Administered 2020-03-25 – 2020-03-28 (×6): 100 mg via ORAL
  Filled 2020-03-25 (×6): qty 1

## 2020-03-25 MED ORDER — QUETIAPINE FUMARATE 25 MG PO TABS
12.5000 mg | ORAL_TABLET | Freq: Once | ORAL | Status: AC
  Administered 2020-03-25: 12.5 mg via ORAL
  Filled 2020-03-25: qty 1

## 2020-03-25 MED ORDER — DONEPEZIL HCL 10 MG PO TABS
10.0000 mg | ORAL_TABLET | Freq: Every day | ORAL | Status: DC
  Filled 2020-03-25: qty 2

## 2020-03-25 NOTE — ED Notes (Signed)
2nd Blood Culture was unsuccessful. Nurse aware.

## 2020-03-25 NOTE — ED Notes (Signed)
Date and time results received: 03/25/20 2:58 PM  (use smartphrase ".now" to insert current time)  Test: lactic acid Critical Value: 2.1  Name of Provider Notified: will move to next available treatment room   Orders Received? Or Actions Taken?:

## 2020-03-25 NOTE — Progress Notes (Signed)
Noticed upon admission that patient has redness, warmth, and swelling to his Left foot. Pt.denies pain. NP on call was notified and came up to evaluate patient. Roderick Pee

## 2020-03-25 NOTE — ED Provider Notes (Signed)
[facility] DEPT Provider Note   CSN: [id number] Arrival date & time: 03/25/20  1120     History Chief complaint: Positive blood culture  Justin Lester is a 86 y.o. male.  HPI   Patient presents to the ED after having a positive blood culture from a visit at [facility] emergency room yesterday.  History was provided by the patient and the patient's wife today.  Patient does have dementia so the wife provides a good portion of the history.  Yesterday morning the patient had a fever at home up to 103.  He also had diffuse body shaking at the time.  There was no loss of consciousness.  Patient denied having any trouble with cough.  No urinary symptoms.  No vomiting or diarrhea.  Patient had an extensive ED work-up.  He was noted to have a slightly elevated white blood cell count.  His lactic acid level is also slightly elevated.  Otherwise he did not have evidence of Covid, pneumonia or urinary tract infection.  Patient was monitored in the ED and he remained afebrile.  Patient was feeling well and was comfortable going home.  Patient was given a dose of Rocephin empirically while blood cultures were pending.  Patient's wife states last night he did not sleep well and seemed to be warm but no definite recurrent fevers.  No recurrent shaking episodes.  He did start complaining of pain in his right shoulder.  He does not recall any falls or injuries.  Has not noticed any redness but it does hurt to move his right shoulder.  They were called about the positive blood cultures and presented to the ED for further evaluation  Past Medical History:  Diagnosis Date  . Dementia ([place])   . Esophageal reflux   . Old myocardial infarct   . Prostate cancer [facility])     Patient Active Problem List   Diagnosis Date Noted  . Continuous leakage of urine 06/10/2019  . SDAT (senile dementia of Alzheimer's type) ([place]) 06/10/2019  . Bilateral bunions 11/24/2018  . Metatarsalgia of both  feet 11/24/2018  . Pre-ulcerative calluses 11/24/2018  . Toenail fungus 11/24/2018  . H/O aortic aneurysm repair 08/05/2018  . Bilateral carotid artery stenosis 07/14/2016  . Abdominal aortic aneurysm without rupture ([place]) 07/14/2016  . Coronary artery disease involving native coronary artery of native heart with angina pectoris ([place]) 04/26/2015  . Essential hypertension 04/26/2015  . Hyperlipidemia 04/26/2015  . RBBB 04/26/2015  . Ruptured abdominal aortic aneurysm ([place]) 04/26/2015    Past Surgical History:  Procedure Laterality Date  . ABDOMINAL AORTA STENT    . CORONARY ARTERY BYPASS GRAFT    . HERNIA REPAIR    . PROSTATE SURGERY         Family History  Problem Relation Age of Onset  . CAD Mother   . CAD Sister   . CAD Brother     Social History   Tobacco Use  . Smoking status: Former Research scientist (life sciences)  . Smokeless tobacco: Never Used  Vaping Use  . Vaping Use: Never used  Substance Use Topics  . Alcohol use: No  . Drug use: No    Home Medications Prior to Admission medications   Medication Sig Start Date End Date Taking? Authorizing Provider  aspirin EC 81 MG tablet Take 81 mg by mouth as directed. Take on MWF   Yes [provider]  atorvastatin (LIPITOR) 10 MG tablet TAKE 1 TABLET BY MOUTH  DAILY 03/06/20  Yes  Richardo Priest, MD  Cholecalciferol (VITAMIN D PO) Take 2,000 Units by mouth daily.   Yes [provider]  Co-Enzyme Q-10 30 MG CAPS Take 10 mg by mouth daily.   Yes [provider]  diclofenac Sodium (VOLTAREN) 1 % GEL Apply 2 g topically 4 (four) times daily as needed (pain).  03/06/20  Yes [provider]  ibuprofen (ADVIL) 800 MG tablet Take 800 mg by mouth every 8 (eight) hours as needed for moderate pain.  01/19/20  Yes [provider]  magnesium oxide (MAG-OX) 400 MG tablet Take 400 mg by mouth daily.   Yes [provider]  Memantine HCl-Donepezil HCl (NAMZARIC) 28-10 MG CP24 Take 1 capsule by mouth daily.    Yes [provider]  Multiple Vitamin (MULTI-VITAMINS) TABS Take 1 tablet by mouth daily.   Yes [provider]  ramipril (ALTACE) 2.5 MG capsule TAKE 1 CAPSULE BY MOUTH  DAILY 03/06/20  Yes Richardo Priest, MD  traZODone (DESYREL) 50 MG tablet Take 50 mg by mouth at bedtime.  02/13/20  Yes [provider]  vitamin B-12 (CYANOCOBALAMIN) 1000 MCG tablet Take 1 tablet by mouth daily.   Yes [provider]  Zinc 50 MG CAPS Take 50 mg by mouth daily.   Yes [provider]  metoprolol succinate (TOPROL-XL) 50 MG 24 hr tablet Take 0.5 tablets (25 mg total) by mouth daily. Take with or immediately following a meal. 02/14/20   Munley, Hilton Cork, MD    Allergies    Patient has no known allergies.  Review of Systems   Review of Systems  All other systems reviewed and are negative.   Physical Exam Updated Vital Signs BP (!) 119/56   Pulse (!) 56   Temp 98.1 F (36.7 C) (Oral)   Resp 18   Ht 1.702 m (5\' 7" )   Wt 68 kg   SpO2 97%   BMI 23.49 kg/m   Physical Exam Vitals and nursing note reviewed.  Constitutional:      Appearance: He is well-developed. He is not diaphoretic.  HENT:     Head: Normocephalic and atraumatic.     Right Ear: External ear normal.     Left Ear: External ear normal.  Eyes:     General: No scleral icterus.       Right eye: No discharge.        Left eye: No discharge.     Conjunctiva/sclera: Conjunctivae normal.  Neck:     Trachea: No tracheal deviation.  Cardiovascular:     Rate and Rhythm: Normal rate and regular rhythm.  Pulmonary:     Effort: Pulmonary effort is normal. No respiratory distress.     Breath sounds: Normal breath sounds. No stridor. No wheezing or rales.  Abdominal:     General: Bowel sounds are normal. There is no distension.     Palpations: Abdomen is soft.     Tenderness: There is no abdominal tenderness. There is no guarding or rebound.  Musculoskeletal:        General: Tenderness present.      Cervical back: Neck supple.     Comments: Mild pain with range of motion right shoulder, no erythema, fluctuance, no swelling noted, soft tissue consistent with lipoma left periscapular region  Skin:    General: Skin is warm and dry.     Findings: No rash.  Neurological:     Mental Status: He is alert.     Cranial Nerves: No cranial  nerve deficit (no facial droop, extraocular movements intact, no slurred speech).     Sensory: No sensory deficit.     Motor: No abnormal muscle tone or seizure activity.     Coordination: Coordination normal.     ED Results / Procedures / Treatments   Labs (all labs ordered are listed, but only abnormal results are displayed) Labs Reviewed  CBC - Abnormal; Notable for the following components:      Result Value   WBC 17.6 (*)    Platelets 132 (*)    All other components within normal limits  COMPREHENSIVE METABOLIC PANEL - Abnormal; Notable for the following components:   Glucose, Bld 132 (*)    GFR calc non Af Amer 59 (*)    All other components within normal limits  LACTIC ACID, PLASMA - Abnormal; Notable for the following components:   Lactic Acid, Venous 2.1 (*)    All other components within normal limits  URINE CULTURE  CULTURE, BLOOD (ROUTINE X 2)  CULTURE, BLOOD (ROUTINE X 2)  SARS CORONAVIRUS 2 BY RT PCR (HOSPITAL ORDER, [place])  LACTIC ACID, PLASMA  URINALYSIS, ROUTINE W REFLEX MICROSCOPIC    EKG None  Radiology DG Chest 2 View  Result Date: 03/24/2020 CLINICAL DATA:  Fever. EXAM: CHEST - 2 VIEW COMPARISON:  September 03, 2015 FINDINGS: The heart size and mediastinal contours are within normal limits. Both lungs are clear. The visualized skeletal structures are unremarkable. IMPRESSION: No active cardiopulmonary disease. Electronically Signed   By: Dorise Bullion III M.D   On: 03/24/2020 10:29   DG Shoulder Right  Result Date: 03/25/2020 CLINICAL DATA:  Fever and pain EXAM: RIGHT SHOULDER - 2+ VIEW  COMPARISON:  None. FINDINGS: There is no evidence of fracture or dislocation. There is no evidence of arthropathy or other focal bone abnormality. Soft tissues are unremarkable. IMPRESSION: Negative. Electronically Signed   By: Dorise Bullion III M.D   On: 03/25/2020 15:50    Procedures Procedures (including critical care time)  Medications Ordered in ED Medications  cefTRIAXone (ROCEPHIN) 1 g in sodium chloride 0.9 % 100 mL IVPB (1 g Intravenous New Bag/Given (Non-Interop) 03/25/20 1601)  vancomycin (VANCOREADY) IVPB 1500 mg/300 mL (has no administration in time range)  sodium chloride 0.9 % bolus 1,000 mL (1,000 mLs Intravenous New Bag/Given (Non-Interop) 03/25/20 1608)    ED Course  I have reviewed the triage vital signs and the nursing notes.  Pertinent labs & imaging results that were available during my care of the patient were reviewed by me and considered in my medical decision making (see chart for details).  Clinical Course as of Mar 25 1625  Sun Mar 25, 2020  1505 Cultures from yesterday.:  Streptococcus species NOT DETECTED DETECTEDAbnormal  Comment: CRITICAL RESULT CALLED TO, READ BACK BY AND VERIFIED WITH:  rn nchp s maynard [id number] at 825 am by cm  Streptococcus agalactiae NOT DETECTED DETECTEDAbnormal  Comment: CRITICAL RESULT CALLED TO, READ BACK BY AND VERIFIED WITH     [JK]  1617 Shoulder xray normal   [JK]    Clinical Course User Index [JK] Dorie Rank, MD   MDM Rules/Calculators/A&P                          Pt presents for positive blood culture from yesterdays visit.  Patient fortunately remained stable.  No signs of sepsis.  Etiology of his bacteremia is unclear.  No definite source.  Patient does complain of some shoulder pain x-rays do not show any acute abnormalities.  Low suspicion for septic arthritis based on his exam.  Patient has been given empiric antibiotics.  He was given a dose of rocephin yesterday. Repeat blood cultures have been ordered.   Plan admission to the hospital for IV antibiotics and further treatment. Final Clinical Impression(s) / ED Diagnoses Final diagnoses:  Bacteremia      Dorie Rank, MD 03/25/20 [phone]

## 2020-03-25 NOTE — H&P (Signed)
History and Physical    Justin Lester TKP:[id number] DOB: 12-02-1930 DOA: 03/25/2020  PCP: [name], MD Patient coming from: Home.  Lives with his wife.  Independently ambulates at baseline.  Chief Complaint: Positive blood culture  HPI: Justin Lester is a 86 y.o. male with history of Alzheimer's dementia, prostate cancer, RBBB, HTN, AAA and urinary incontinence return to the hospital due to positive blood cultures.  Patient with advanced dementia.  He is only oriented to self and not able to provide history.  History obtained based on chart review, EDP and patient's wife over the phone. Patient presented to [facility], ED yesterday with fever to 103, chills and rigors.  Had work-up significant for lactic acidosis to 2.1 and leukocytosis to 14 with left shift.  His urinalysis, COVID-19 PCR and chest x-ray were unrevealing.  Blood cultures obtained and patient was discharged home from ED.  Patient BCID with Streptococcus agalactiae.  Cultures pending.  He was advised to return to the hospital for further evaluation and treatment.   Per patient's wife, patient was in his usual state of health 2 days ago.  He developed fever, chills, rigors, right shoulder pain and worsening confusion yesterday.  Denies fall, trauma, URI symptoms, chest pain, dyspnea, GI, new UTI symptoms, sick contacts or known COVID-19 exposure.  Per patient's wife, patient has urinary incontinence and he alternates between condom cath and "penile clamp".  Patient is unvaccinated against COVID-19.  Per patient's wife, patient lives with his wife  Independently ambulates at baseline.  Denies smoking cigarettes, drinking alcohol recreational drug use.   In ED, afebrile.  Hemodynamically stable.  100% on room air.  CMP without significant finding.  WBC 17.6.  Lactic acid 2.1>>> 1.7.  Platelet 132.  UA with 20 ketones and 30 proteins.  Right shoulder x-ray without significant finding.  Blood and urine cultures obtained.   Patient was started on IV vancomycin.  Hospitalist service called for admission for possible bacteremia.  ROS All review of system as obtained from patient's wife negative except for pertinent positives and negatives as history of present illness above. PMH Past Medical History:  Diagnosis Date  . Dementia ([place])   . Esophageal reflux   . Old myocardial infarct   . Prostate cancer ([place])    [place] Past Surgical History:  Procedure Laterality Date  . ABDOMINAL AORTA STENT    . CORONARY ARTERY BYPASS GRAFT    . HERNIA REPAIR    . PROSTATE SURGERY     Fam HX Family History  Problem Relation Age of Onset  . CAD Mother   . CAD Sister   . CAD Brother     Social Hx  reports that he has quit smoking. He has never used smokeless tobacco. He reports that he does not drink alcohol and does not use drugs.  Allergy No Known Allergies Home Meds Prior to Admission medications   Medication Sig Start Date End Date Taking? Authorizing Provider  aspirin EC 81 MG tablet Take 81 mg by mouth as directed. Take on MWF   Yes [provider]  atorvastatin (LIPITOR) 10 MG tablet TAKE 1 TABLET BY MOUTH  DAILY 03/06/20  Yes Richardo Priest, MD  Cholecalciferol (VITAMIN D PO) Take 2,000 Units by mouth daily.   Yes [provider]  Co-Enzyme Q-10 30 MG CAPS Take 10 mg by mouth daily.   Yes [provider]  diclofenac Sodium (VOLTAREN) 1 % GEL Apply 2 g topically 4 (four) times daily as  needed (pain).  03/06/20  Yes [provider]  ibuprofen (ADVIL) 800 MG tablet Take 800 mg by mouth every 8 (eight) hours as needed for moderate pain.  01/19/20  Yes [provider]  magnesium oxide (MAG-OX) 400 MG tablet Take 400 mg by mouth daily.   Yes [provider]  Memantine HCl-Donepezil HCl (NAMZARIC) 28-10 MG CP24 Take 1 capsule by mouth daily.   Yes [provider]  Multiple Vitamin (MULTI-VITAMINS) TABS Take 1 tablet by mouth daily.   Yes [provider]  ramipril (ALTACE) 2.5 MG capsule TAKE 1 CAPSULE BY MOUTH  DAILY 03/06/20  Yes Richardo Priest, MD  traZODone (DESYREL) 50 MG tablet Take 50 mg by mouth at bedtime.  02/13/20  Yes [provider]  vitamin B-12 (CYANOCOBALAMIN) 1000 MCG tablet Take 1 tablet by mouth daily.   Yes [provider]  Zinc 50 MG CAPS Take 50 mg by mouth daily.   Yes [provider]  metoprolol succinate (TOPROL-XL) 50 MG 24 hr tablet Take 0.5 tablets (25 mg total) by mouth daily. Take with or immediately following a meal. 02/14/20   Richardo Priest, MD    Physical Exam: Vitals:   03/25/20 1608 03/25/20 1630 03/25/20 1711 03/25/20 1801  BP: (!) 119/56 124/68 119/60 (!) 104/91  Pulse: (!) 56 68 82 74  Resp: 18 17 16 16   Temp:      TempSrc:      SpO2: 97% 96% 100% 100%  Weight:      Height:        GENERAL: No acute distress.  Appears well.  HEENT: MMM.  Vision and hearing grossly intact.  NECK: Supple.  No apparent JVD.  RESP:  No IWOB. Good air movement bilaterally. CVS:  RRR. Heart sounds normal.  ABD/GI/GU: Bowel sounds present. Soft. Non tender.  MSK/EXT: No apparent deformity.  Limited range of motion in right shoulder.  Tenderness over anterior aspect of right shoulder but no erythema or fluid loculation. SKIN: no apparent skin lesion or wound NEURO: Awake and alert.  Only oriented to self.  No gross deficit.  PSYCH: Calm. Normal affect.   Personally Reviewed Radiological Exams DG Chest 2 View  Result Date: 03/24/2020 CLINICAL DATA:  Fever. EXAM: CHEST - 2 VIEW COMPARISON:  September 03, 2015 FINDINGS: The heart size and mediastinal contours are within normal limits. Both lungs are clear. The visualized skeletal structures are unremarkable. IMPRESSION: No active cardiopulmonary disease. Electronically Signed   By: Dorise Bullion III M.D   On: 03/24/2020 10:29   DG Shoulder Right  Result Date: 03/25/2020 CLINICAL DATA:  Fever and pain EXAM: RIGHT SHOULDER - 2+  VIEW COMPARISON:  None. FINDINGS: There is no evidence of fracture or dislocation. There is no evidence of arthropathy or other focal bone abnormality. Soft tissues are unremarkable. IMPRESSION: Negative. Electronically Signed   By: Dorise Bullion III M.D   On: 03/25/2020 15:50     Personally Reviewed Labs: CBC: Recent Labs  Lab 03/24/20 0928 03/25/20 1346  WBC 13.9* 17.6*  NEUTROABS 13.0*  --   HGB 15.5 14.9  HCT 46.3 45.2  MCV 91.9 94.8  PLT 165 235*   Basic Metabolic Panel: Recent Labs  Lab 03/24/20 0928 03/25/20 1346  NA 144 141  K 3.9 4.2  CL 110 108  CO2 21* 24  GLUCOSE 146* 132*  BUN 20 20  CREATININE 1.19 1.11  CALCIUM 9.1 8.9   GFR: Estimated Creatinine Clearance: 43.8 mL/min (by  C-G formula based on SCr of 1.11 mg/dL). Liver Function Tests: Recent Labs  Lab 03/24/20 0928 03/25/20 1346  AST 23 31  ALT 19 19  ALKPHOS 63 54  BILITOT 1.3* 1.0  PROT 6.4* 6.6  ALBUMIN 4.1 4.0   No results for input(s): LIPASE, AMYLASE in the last 168 hours. No results for input(s): AMMONIA in the last 168 hours. Coagulation Profile: Recent Labs  Lab 03/24/20 1441  INR 1.1   Cardiac Enzymes: No results for input(s): CKTOTAL, CKMB, CKMBINDEX, TROPONINI in the last 168 hours. BNP (last 3 results) No results for input(s): PROBNP in the last 8760 hours. HbA1C: No results for input(s): HGBA1C in the last 72 hours. CBG: No results for input(s): GLUCAP in the last 168 hours. Lipid Profile: No results for input(s): CHOL, HDL, LDLCALC, TRIG, CHOLHDL, LDLDIRECT in the last 72 hours. Thyroid Function Tests: No results for input(s): TSH, T4TOTAL, FREET4, T3FREE, THYROIDAB in the last 72 hours. Anemia Panel: No results for input(s): VITAMINB12, FOLATE, FERRITIN, TIBC, IRON, RETICCTPCT in the last 72 hours. Urine analysis:    Component Value Date/Time   COLORURINE YELLOW 03/25/2020 1346   APPEARANCEUR CLEAR 03/25/2020 1346   LABSPEC 1.024 03/25/2020 1346   PHURINE 5.0  03/25/2020 1346   GLUCOSEU NEGATIVE 03/25/2020 1346   HGBUR NEGATIVE 03/25/2020 1346   BILIRUBINUR NEGATIVE 03/25/2020 1346   KETONESUR 20 (A) 03/25/2020 1346   PROTEINUR 30 (A) 03/25/2020 1346   NITRITE NEGATIVE 03/25/2020 1346   LEUKOCYTESUR NEGATIVE 03/25/2020 1346    Sepsis Labs:  Lactic acid 2.1>>> 1.7.  Personally Reviewed EKG:  No EKG obtained.  Assessment/Plan Active Problems:   Streptococcal bacteremia Patient presented to [facility] on 8/21 with acute fever, chills and rigor concerning for true bacteremia.  BCID with Streptococcus agalactiae.  Cultures pending.  Unclear source of infection.  He has right shoulder pain with tenderness over anterior aspect which is concerning.  Also limited range of motion in the shoulder. -Follow cultures -Continue IV vancomycin -Consider MRI right shoulder in the morning if blood cultures are positive.  Right shoulder pain: Unclear etiology of this.  Has tenderness over anterior aspect but no erythema or swelling.  Also limited range of motion.  Shoulder x-ray without acute finding. -Consider MRI of right shoulder if true bacteremia.  Alzheimer's dementia without behavioral disturbance -Continue home medications -Reorientation's and delirium precautions  History of CAD s/p CABG: Denies cardiopulmonary symptoms. -Continue home medications  History of prostate cancer/urinary incontinence-switch between condom cath and penile clamp at home -continue condom catheter.  Essential hypertension: Normotensive for most part. -Continue home medications  History of AAA: Stable -Blood pressure control  DVT prophylaxis: Subcu Lovenox  Code Status: DNR/DNI-confirmed with patient's wife. Family Communication: Updated patient's wife over the phone  Disposition Plan: Admit to MedSurg Consults called: None Admission status: Inpatient.  Patient has significant evidence of true infection including fever, rigors, chills, leukocytosis and lactic  acidosis.  BC ID on initial blood culture on 8/21 with Streptococcus agalactiae.  Repeat blood cultures are pending.  He would be at significant risk for deconditioning and adverse outcome.   I certify that at the point of admission it is my clinical judgment that the patient will require inpatient hospital care spanning beyond 2 midnights from the point of admission due to high intensity of service, high risk for further deterioration and high frequency of surveillance required.   Mercy Riding MD [facility]  If 7PM-7AM, please contact night-coverage [email or web address]  03/25/2020, 6:13 PM

## 2020-03-25 NOTE — ED Triage Notes (Signed)
Patient at ED due to positive blood culture. Denies pain at this time. Has some confusion about why he is here.

## 2020-03-25 NOTE — Progress Notes (Signed)
Pharmacy Antibiotic Note  Justin Lester is a 86 y.o. male admitted on 03/25/2020 with Streptococcal bacteremia, unclear source. Pharmacy has been consulted for Penicillin G dosing.  Plan: Penicillin G 4 million units IV q6h Monitor renal function, cultures, clinical course  Height: 5\' 7"  (170.2 cm) Weight: 68 kg (150 lb) IBW/kg (Calculated) : 66.1  Temp (24hrs), Avg:98.7 F (37.1 C), Min:98.1 F (36.7 C), Max:99.5 F (37.5 C)  Recent Labs  Lab 03/24/20 0928 03/24/20 1441 03/25/20 1346 03/25/20 1546  WBC 13.9*  --  17.6*  --   CREATININE 1.19  --  1.11  --   LATICACIDVEN 2.0* 2.3* 2.1* 1.7    Estimated Creatinine Clearance: 43.8 mL/min (by C-G formula based on SCr of 1.11 mg/dL).    No Known Allergies  Antimicrobials this admission: 8/22 Ceftriaxone x 1 8/22 Vancomycin x 1 8/23 Penicillin G >>  Dose adjustments this admission: --  Microbiology results: 8/21 BCx: 1/2 sets GPC in chains (BCID = Streptococcus agalactiae) 8/21 UCx: reincubated for better growth  8/22 BCx: sent 8/22 UCx: sent 8/22 COVID: negative     Thank you for allowing pharmacy to be a part of this patients care.  Justin Lester 03/25/2020 9:27 PM

## 2020-03-25 NOTE — Progress Notes (Signed)
A consult was received from an ED physician for vancomycin per pharmacy dosing.  The patient's profile has been reviewed for ht/wt/allergies/indication/available labs.    A one time order has been placed for vancomycin 1500 mg IV once.   Further antibiotics/pharmacy consults should be ordered by admitting physician if indicated.                       Thank you, Lenis Noon, PharmD 03/25/2020  8:22 PM

## 2020-03-25 NOTE — Progress Notes (Signed)
I attempted to call patient's wife Justin Lester to obtain admission history. Wife did not answer, but was left a voicemail.  Patient is only alert to self at this time and a poor historian.  He has a hx of Dementia.  Wife apparently will be getting a procedure tomorrow morning. Will have day RN obtain admission history from wife after her procedure.Justin Lester

## 2020-03-26 ENCOUNTER — Inpatient Hospital Stay (HOSPITAL_COMMUNITY): Payer: Medicare Other

## 2020-03-26 DIAGNOSIS — R32 Unspecified urinary incontinence: Secondary | ICD-10-CM

## 2020-03-26 DIAGNOSIS — F0390 Unspecified dementia without behavioral disturbance: Secondary | ICD-10-CM

## 2020-03-26 DIAGNOSIS — R7881 Bacteremia: Secondary | ICD-10-CM

## 2020-03-26 LAB — COMPREHENSIVE METABOLIC PANEL
ALT: 17 U/L (ref 0–44)
AST: 27 U/L (ref 15–41)
Albumin: 3.4 g/dL — ABNORMAL LOW (ref 3.5–5.0)
Alkaline Phosphatase: 48 U/L (ref 38–126)
Anion gap: 8 (ref 5–15)
BUN: 15 mg/dL (ref 8–23)
CO2: 24 mmol/L (ref 22–32)
Calcium: 8.4 mg/dL — ABNORMAL LOW (ref 8.9–10.3)
Chloride: 108 mmol/L (ref 98–111)
Creatinine, Ser: 1.03 mg/dL (ref 0.61–1.24)
GFR calc Af Amer: >60 mL/min (ref >60)
GFR calc non Af Amer: >60 mL/min (ref >60)
Glucose, Bld: 110 mg/dL — ABNORMAL HIGH (ref 70–99)
Potassium: 3.6 mmol/L (ref 3.5–5.1)
Sodium: 140 mmol/L (ref 135–145)
Total Bilirubin: 0.8 mg/dL (ref 0.3–1.2)
Total Protein: 5.8 g/dL — ABNORMAL LOW (ref 6.5–8.1)

## 2020-03-26 LAB — URINE CULTURE
Culture: 50000 — AB
Culture: NO GROWTH

## 2020-03-26 LAB — CBC
HCT: 41.6 % (ref 39.0–52.0)
Hemoglobin: 14.3 g/dL (ref 13.0–17.0)
MCH: 31.8 pg (ref 26.0–34.0)
MCHC: 34.4 g/dL (ref 30.0–36.0)
MCV: 92.7 fL (ref 80.0–100.0)
Platelets: 136 10*3/uL — ABNORMAL LOW (ref 150–400)
RBC: 4.49 MIL/uL (ref 4.22–5.81)
RDW: 13.2 % (ref 11.5–15.5)
WBC: 10.8 10*3/uL — ABNORMAL HIGH (ref 4.0–10.5)
nRBC: 0 % (ref 0.0–0.2)

## 2020-03-26 LAB — ECHOCARDIOGRAM LIMITED
Height: 67 in
Weight: 2400 oz

## 2020-03-26 MED ORDER — PENICILLIN G POTASSIUM 20000000 UNITS IJ SOLR
10.0000 10*6.[IU] | Freq: Two times a day (BID) | INTRAVENOUS | Status: DC
  Administered 2020-03-26 – 2020-03-28 (×4): 10 10*6.[IU] via INTRAVENOUS
  Filled 2020-03-26 (×4): qty 10

## 2020-03-26 MED ORDER — DONEPEZIL HCL 10 MG PO TABS
10.0000 mg | ORAL_TABLET | Freq: Every day | ORAL | Status: DC
  Administered 2020-03-26 – 2020-03-28 (×3): 10 mg via ORAL
  Filled 2020-03-26 (×3): qty 1

## 2020-03-26 MED ORDER — MEMANTINE HCL ER 28 MG PO CP24
28.0000 mg | ORAL_CAPSULE | Freq: Every day | ORAL | Status: DC
  Administered 2020-03-26 – 2020-03-28 (×3): 28 mg via ORAL
  Filled 2020-03-26 (×3): qty 1

## 2020-03-26 NOTE — Assessment & Plan Note (Signed)
-   continue condom cath

## 2020-03-26 NOTE — Hospital Course (Signed)
Justin Lester is an 86 y.o. male with history of Alzheimer's dementia, prostate cancer, RBBB, HTN, AAA and urinary incontinence who presented to the hospital due to positive blood cultures.   Patient presented to [facility], ED with fever to 103, chills and rigors.  Had work-up significant for lactic acidosis to 2.1 and leukocytosis to 14 with left shift.  His urinalysis, COVID-19 PCR and chest x-ray were unrevealing.  Blood cultures obtained and patient was discharged home from ED.  He was then called back due to cultures growing Streptococcus agalactiae.    Per patient's wife, patient was in his usual state of health 2 days ago.  He developed fever, chills, rigors, right shoulder pain and worsening confusion yesterday.  Denies fall, trauma, URI symptoms, chest pain, dyspnea, GI, new UTI symptoms, sick contacts or known COVID-19 exposure.  Per patient's wife, patient has urinary incontinence and he alternates between condom cath and "penile clamp".  Patient is unvaccinated against COVID-19.   In ED, afebrile.  Hemodynamically stable.  100% on room air.  CMP without significant finding.  WBC 17.6.  Lactic acid 2.1>>> 1.7.  Platelet 132.  UA with 20 ketones and 30 proteins.  Right shoulder x-ray without significant finding.  Blood and urine cultures obtained. Patient was initially started on vancomycin which was transitioned to penicillin G. Given no clear source of his bacteremia but ongoing acute/subacute pain in his right shoulder and thoracic/lumbar spine, he was ordered MRI for further evaluation.

## 2020-03-26 NOTE — Assessment & Plan Note (Signed)
-  Patient presented to [facility], ER initially with high fevers at home.  No obvious source at this time.  He has however been complaining of worsening pain in his right shoulder which seems to be semichronic in nature but worsened in the last several days.  He is also now complaining of what sounds like newer onset back pain.  He seems to localize the pain to his lower thoracic and upper lumbar spine.  He is not endorsing any urinary incontinence or leg numbness/tingling -Initially on vancomycin transitioned to penicillin -Follow-up cultures -Obtain MRI right shoulder and thoracic/lumbar spine -Continue pain control

## 2020-03-26 NOTE — Progress Notes (Signed)
Echocardiogram 2D Echocardiogram has been performed.  Oneal Deputy Sholonda Jobst 03/26/2020, 12:08 PM

## 2020-03-26 NOTE — Assessment & Plan Note (Signed)
-   continue home meds 

## 2020-03-26 NOTE — Assessment & Plan Note (Signed)
-   continue home meds and re-orientation as needed

## 2020-03-26 NOTE — Progress Notes (Signed)
PROGRESS NOTE    Justin Lester   ERX:[id number]  DOB: Nov 25, 1930  DOA: 03/25/2020     1  PCP: [name], MD  CC: fevers  Hospital Course: Justin Lester is an 86 y.o. male with history of Alzheimer's dementia, prostate cancer, RBBB, HTN, AAA and urinary incontinence who presented to the hospital due to positive blood cultures.   Patient presented to [facility] ED with fever to 103, chills and rigors.  Had work-up significant for lactic acidosis to 2.1 and leukocytosis to 14 with left shift.  His urinalysis, COVID-19 PCR and chest x-ray were unrevealing.  Blood cultures obtained and patient was discharged home from ED.  He was then called back due to cultures growing Streptococcus agalactiae.    Per patient's wife, patient was in his usual state of health 2 days ago.  He developed fever, chills, rigors, right shoulder pain and worsening confusion yesterday.  Denies fall, trauma, URI symptoms, chest pain, dyspnea, GI, new UTI symptoms, sick contacts or known COVID-19 exposure.  Per patient's wife, patient has urinary incontinence and he alternates between condom cath and "penile clamp".  Patient is unvaccinated against COVID-19.   In ED, afebrile.  Hemodynamically stable.  100% on room air.  CMP without significant finding.  WBC 17.6.  Lactic acid 2.1>>> 1.7.  Platelet 132.  UA with 20 ketones and 30 proteins.  Right shoulder x-ray without significant finding.  Blood and urine cultures obtained. Patient was initially started on vancomycin which was transitioned to penicillin G. Given no clear source of his bacteremia but ongoing acute/subacute pain in his right shoulder and thoracic/lumbar spine, he was ordered MRI for further evaluation.   Interval History:  No events overnight.  He was noted to be confused on admission but this morning is much more awake and alert and able to answer all questions.  His main complaints seem to be his worsening right shoulder pain as well as new onset  of back pain.  He denies any recent falls or injuries.  Old records reviewed in assessment of this patient  ROS: Constitutional: positive for fatigue and malaise, Respiratory: negative for cough, Cardiovascular: negative for chest pain and Gastrointestinal: negative for abdominal pain  Assessment & Plan: Streptococcal bacteremia -Patient presented to [facility] ER initially with high fevers at home.  No obvious source at this time.  He has however been complaining of worsening pain in his right shoulder which seems to be semichronic in nature but worsened in the last several days.  He is also now complaining of what sounds like newer onset back pain.  He seems to localize the pain to his lower thoracic and upper lumbar spine.  He is not endorsing any urinary incontinence or leg numbness/tingling -Initially on vancomycin transitioned to penicillin -Follow-up cultures -Obtain MRI right shoulder and thoracic/lumbar spine -Continue pain control  Dementia (HCC) - continue home meds and re-orientation as needed  Coronary artery disease involving native coronary artery of native heart with angina pectoris ([place]) - continue home meds  Urinary incontinence - continue condom cath  Essential hypertension - continue home meds   Antimicrobials: Vancomycin 03/25/2020 x 1 Rocephin 03/25/2020 x 1 Penicillin G 03/25/2020>> present  DVT prophylaxis: Lovenox Code Status: DNR Family Communication: None present Disposition Plan: Status is: Inpatient  Remains inpatient appropriate because:Altered mental status, Ongoing diagnostic testing needed not appropriate for outpatient work up, Unsafe d/c plan, IV treatments appropriate due to intensity of illness or inability to take PO and Inpatient level  of care appropriate due to severity of illness   Dispo: The patient is from: Home              Anticipated d/c is to: Pending PT eval              Anticipated d/c date is: 3 days              Patient  currently is not medically stable to d/c.  Objective: Blood pressure 130/86, pulse 60, temperature 98.1 F (36.7 C), temperature source Oral, resp. rate 16, height 5\' 7"  (1.702 m), weight 68 kg, SpO2 (!) 74 %.  Examination: General appearance: Pleasant elderly man lying in bed in no obvious distress and is slightly uncomfortable from back pain and is constantly changing positions in the bed Head: Normocephalic, without obvious abnormality, atraumatic Eyes: EOMI Lungs: clear to auscultation bilaterally Heart: regular rate and rhythm and S1, S2 normal Abdomen: normal findings: bowel sounds normal and soft, non-tender  Back: Spinal tenderness appreciated in lower thoracic and upper lumbar fields.  No obvious paraspinal tenderness nor edema Extremities: No edema Skin: Warm, dry, intact, no obvious lacerations or large skin wounds Neurologic: Grossly normal  Consultants:   None  Procedures:   None  Data Reviewed: I have personally reviewed following labs and imaging studies Results for orders placed or performed during the hospital encounter of 03/25/20 (from the past 24 hour(s))  SARS Coronavirus 2 by RT PCR (hospital order, performed in [facility] hospital lab) Nasopharyngeal Nasopharyngeal Swab     Status: None   Collection Time: 03/25/20  4:19 PM   Specimen: Nasopharyngeal Swab  Result Value Ref Range   SARS Coronavirus 2 NEGATIVE NEGATIVE  CBC     Status: Abnormal   Collection Time: 03/26/20  9:15 AM  Result Value Ref Range   WBC 10.8 (H) 4.0 - 10.5 K/uL   RBC 4.49 4.22 - 5.81 MIL/uL   Hemoglobin 14.3 13.0 - 17.0 g/dL   HCT 41.6 39 - 52 %   MCV 92.7 80.0 - 100.0 fL   MCH 31.8 26.0 - 34.0 pg   MCHC 34.4 30.0 - 36.0 g/dL   RDW 13.2 11.5 - 15.5 %   Platelets 136 (L) 150 - 400 K/uL   nRBC 0.0 0.0 - 0.2 %  Comprehensive metabolic panel     Status: Abnormal   Collection Time: 03/26/20  9:15 AM  Result Value Ref Range   Sodium 140 135 - 145 mmol/L   Potassium 3.6 3.5 - 5.1  mmol/L   Chloride 108 98 - 111 mmol/L   CO2 24 22 - 32 mmol/L   Glucose, Bld 110 (H) 70 - 99 mg/dL   BUN 15 8 - 23 mg/dL   Creatinine, Ser 1.03 0.61 - 1.24 mg/dL   Calcium 8.4 (L) 8.9 - 10.3 mg/dL   Total Protein 5.8 (L) 6.5 - 8.1 g/dL   Albumin 3.4 (L) 3.5 - 5.0 g/dL   AST 27 15 - 41 U/L   ALT 17 0 - 44 U/L   Alkaline Phosphatase 48 38 - 126 U/L   Total Bilirubin 0.8 0.3 - 1.2 mg/dL   GFR calc non Af Amer >60 >60 mL/min   GFR calc Af Amer >60 >60 mL/min   Anion gap 8 5 - 15    Recent Results (from the past 240 hour(s))  Culture, blood (Routine x 2)     Status: Abnormal (Preliminary result)   Collection Time: 03/24/20  2:41 PM  Specimen: BLOOD  Result Value Ref Range Status   Specimen Description BLOOD LEFT ANTECUBITAL  Final   Special Requests   Final    BOTTLES DRAWN AEROBIC AND ANAEROBIC Blood Culture adequate volume   Culture  Setup Time   Final    GRAM POSITIVE COCCI IN CHAINS IN BOTH AEROBIC AND ANAEROBIC BOTTLES Organism ID to follow CRITICAL RESULT CALLED TO, READ BACK BY AND VERIFIED WITH: rn at [facility] [phone] at 825 am by cm to s maynard    Culture (A)  Final    GROUP B STREP(S.AGALACTIAE)ISOLATED SUSCEPTIBILITIES TO FOLLOW Performed at [facility], [place] [address] [place], [place] [address]    Report Status PENDING  Incomplete  Blood Culture ID Panel (Reflexed)     Status: Abnormal   Collection Time: 03/24/20  2:41 PM  Result Value Ref Range Status   Enterococcus faecalis NOT DETECTED NOT DETECTED Final   Enterococcus Faecium NOT DETECTED NOT DETECTED Final   Listeria monocytogenes NOT DETECTED NOT DETECTED Final   Staphylococcus species NOT DETECTED NOT DETECTED Final   Staphylococcus aureus (BCID) NOT DETECTED NOT DETECTED Final   Staphylococcus epidermidis NOT DETECTED NOT DETECTED Final   Staphylococcus lugdunensis NOT DETECTED NOT DETECTED Final   Streptococcus species DETECTED (A) NOT DETECTED Final    Comment: CRITICAL RESULT CALLED TO, READ BACK  BY AND VERIFIED WITH: rn nchp s maynard [id number] at 825 am by cm    Streptococcus agalactiae DETECTED (A) NOT DETECTED Final    Comment: CRITICAL RESULT CALLED TO, READ BACK BY AND VERIFIED WITH: rn m[facility] s maynard [id number] at 827 am by cm    Streptococcus pneumoniae NOT DETECTED NOT DETECTED Final   Streptococcus pyogenes NOT DETECTED NOT DETECTED Final   A.calcoaceticus-baumannii NOT DETECTED NOT DETECTED Final   Bacteroides fragilis NOT DETECTED NOT DETECTED Final   Enterobacterales NOT DETECTED NOT DETECTED Final   Enterobacter cloacae complex NOT DETECTED NOT DETECTED Final   Escherichia coli NOT DETECTED NOT DETECTED Final   Klebsiella aerogenes NOT DETECTED NOT DETECTED Final   Klebsiella oxytoca NOT DETECTED NOT DETECTED Final   Klebsiella pneumoniae NOT DETECTED NOT DETECTED Final   Proteus species NOT DETECTED NOT DETECTED Final   Salmonella species NOT DETECTED NOT DETECTED Final   Serratia marcescens NOT DETECTED NOT DETECTED Final   Haemophilus influenzae NOT DETECTED NOT DETECTED Final   Neisseria meningitidis NOT DETECTED NOT DETECTED Final   Pseudomonas aeruginosa NOT DETECTED NOT DETECTED Final   Stenotrophomonas maltophilia NOT DETECTED NOT DETECTED Final   Candida albicans NOT DETECTED NOT DETECTED Final   Candida auris NOT DETECTED NOT DETECTED Final   Candida glabrata NOT DETECTED NOT DETECTED Final   Candida krusei NOT DETECTED NOT DETECTED Final   Candida parapsilosis NOT DETECTED NOT DETECTED Final   Candida tropicalis NOT DETECTED NOT DETECTED Final   Cryptococcus neoformans/gattii NOT DETECTED NOT DETECTED Final    Comment: Performed at [facility], [address], [place], [place] [address]  SARS Coronavirus 2 by RT PCR (hospital order, performed in [facility] hospital lab) Nasopharyngeal Nasopharyngeal Swab     Status: None   Collection Time: 03/24/20  2:46 PM   Specimen: Nasopharyngeal Swab  Result Value Ref Range Status   SARS Coronavirus 2  NEGATIVE NEGATIVE Final    Comment: (NOTE) SARS-CoV-2 target nucleic acids are NOT DETECTED.  The SARS-CoV-2 RNA is generally detectable in upper and lower respiratory specimens during the acute phase of infection. The lowest concentration of  SARS-CoV-2 viral copies this assay can detect is 250 copies / mL. A negative result does not preclude SARS-CoV-2 infection and should not be used as the sole basis for treatment or other patient management decisions.  A negative result may occur with improper specimen collection / handling, submission of specimen other than nasopharyngeal swab, presence of viral mutation(s) within the areas targeted by this assay, and inadequate number of viral copies (<250 copies / mL). A negative result must be combined with clinical observations, patient history, and epidemiological information.  Fact Sheet for Patients:   StrictlyIdeas.no  Fact Sheet for Healthcare Providers: BankingDealers.co.za  This test is not yet approved or  cleared by the [place] FDA and has been authorized for detection and/or diagnosis of SARS-CoV-2 by FDA under an Emergency Use Authorization (EUA).  This EUA will remain in effect (meaning this test can be used) for the duration of the COVID-19 declaration under Section 564(b)(1) of the Act, 21 U.S.C. section 360bbb-3(b)(1), unless the authorization is terminated or revoked sooner.  Performed at [facility], [place] [address] [place], [place] [address]   Urine culture     Status: Abnormal   Collection Time: 03/24/20  3:29 PM   Specimen: Urine, Random  Result Value Ref Range Status   Specimen Description URINE, RANDOM  Final   Special Requests NONE  Final   Culture (A)  Final    50,000 COLONIES/mL DIPHTHEROIDS(CORYNEBACTERIUM SPECIES) Standardized susceptibility testing for this organism is not available. Performed at [facility], [place] [address]  [place], [place] [address]    Report Status 03/26/2020 FINAL  Final  Culture, blood (Routine x 2)     Status: None (Preliminary result)   Collection Time: 03/24/20  4:34 PM   Specimen: BLOOD RIGHT HAND  Result Value Ref Range Status   Specimen Description BLOOD RIGHT HAND  Final   Special Requests   Final    BOTTLES DRAWN AEROBIC AND ANAEROBIC Blood Culture results may not be optimal due to an inadequate volume of blood received in culture bottles   Culture   Final    NO GROWTH 2 DAYS Performed at [facility], [place] [address], [place], [place] [address]    Report Status PENDING  Incomplete  Urine culture     Status: None   Collection Time: 03/25/20  1:46 PM   Specimen: Urine, Random  Result Value Ref Range Status   Specimen Description   Final    URINE, RANDOM Performed at [facility] [address], [place], [place] [address]    Special Requests   Final    NONE Performed at [facility], [place] [address] [place], [place] [address]    Culture   Final    NO GROWTH Performed at [facility], [place] [address] [place], [place] [address]    Report Status 03/26/2020 FINAL  Final  Blood culture (routine x 2)     Status: None (Preliminary result)   Collection Time: 03/25/20  1:46 PM   Specimen: BLOOD  Result Value Ref Range Status   Specimen Description   Final    BLOOD BLOOD RIGHT HAND Performed at [facility] [address], [place], [place] [address]    Special Requests   Final    BOTTLES DRAWN AEROBIC ONLY Blood Culture adequate volume Performed at [facility] [address], [place], [place] [address]    Culture   Final  NO GROWTH < 24 HOURS Performed at [facility] [address] [place], [place] [address]    Report Status PENDING  Incomplete  Blood culture (routine x 2)     Status: None (Preliminary result)   Collection Time: 03/25/20  1:51 PM   Specimen: BLOOD RIGHT ARM    Result Value Ref Range Status   Specimen Description   Final    BLOOD RIGHT ARM Performed at [facility], [place] [address], [place], [place] [address]    Special Requests   Final    BOTTLES DRAWN AEROBIC AND ANAEROBIC Blood Culture adequate volume Performed at [place] [address], [place], [place] [address]    Culture   Final    NO GROWTH < 24 HOURS Performed at [facility] [address] [place], [place] [address]    Report Status PENDING  Incomplete  SARS Coronavirus 2 by RT PCR (hospital order, performed in [facility] hospital lab) Nasopharyngeal Nasopharyngeal Swab     Status: None   Collection Time: 03/25/20  4:19 PM   Specimen: Nasopharyngeal Swab  Result Value Ref Range Status   SARS Coronavirus 2 NEGATIVE NEGATIVE Final    Comment: (NOTE) SARS-CoV-2 target nucleic acids are NOT DETECTED.  The SARS-CoV-2 RNA is generally detectable in upper and lower respiratory specimens during the acute phase of infection. The lowest concentration of SARS-CoV-2 viral copies this assay can detect is 250 copies / mL. A negative result does not preclude SARS-CoV-2 infection and should not be used as the sole basis for treatment or other patient management decisions.  A negative result may occur with improper specimen collection / handling, submission of specimen other than nasopharyngeal swab, presence of viral mutation(s) within the areas targeted by this assay, and inadequate number of viral copies (<250 copies / mL). A negative result must be combined with clinical observations, patient history, and epidemiological information.  Fact Sheet for Patients:   StrictlyIdeas.no  Fact Sheet for Healthcare Providers: BankingDealers.co.za  This test is not yet approved or  cleared by the [place] FDA and has been authorized for detection and/or diagnosis of SARS-CoV-2 by FDA under an Emergency Use  Authorization (EUA).  This EUA will remain in effect (meaning this test can be used) for the duration of the COVID-19 declaration under Section 564(b)(1) of the Act, 21 U.S.C. section 360bbb-3(b)(1), unless the authorization is terminated or revoked sooner.  Performed at [facility], [place] [address], [place],  [address]      Radiology Studies: DG Shoulder Right  Result Date: 03/25/2020 CLINICAL DATA:  Fever and pain EXAM: RIGHT SHOULDER - 2+ VIEW COMPARISON:  None. FINDINGS: There is no evidence of fracture or dislocation. There is no evidence of arthropathy or other focal bone abnormality. Soft tissues are unremarkable. IMPRESSION: Negative. Electronically Signed   By: Dorise Bullion III M.D   On: 03/25/2020 15:50   ECHOCARDIOGRAM LIMITED  Result Date: 03/26/2020    ECHOCARDIOGRAM LIMITED REPORT   Patient Name:   ZACHERIAH STUMPE Date of Exam: 03/26/2020 Medical Rec #:  [id number]    Height:       67.0 in Accession #:    [id number]   Weight:       150.0 lb Date of Birth:  1930/11/16    BSA:          1.790 m Patient Age:    23 years     BP:  127/68 mmHg Patient Gender: M            HR:           59 bpm. Exam Location:  Inpatient Procedure: Limited Echo, Color Doppler and Cardiac Doppler Indications:    Bacteremia  History:        Patient has no prior history of Echocardiogram examinations.                 Signs/Symptoms:Alzheimer's; Risk Factors:Hypertension and                 Dyslipidemia.  Sonographer:    Raquel Sarna Senior RDCS Referring Phys: [place]  Sonographer Comments: Technically difficult due to poor echo windows. IMPRESSIONS  1. Cardiac valves are suboptimally visulized to exclude endocarditis.  2. Left ventricular ejection fraction, by estimation, is 60 to 65%. The left ventricle has normal function. The left ventricle has no regional wall motion abnormalities.  3. Right ventricular systolic function is normal. The right ventricular size is normal.  There is mildly elevated pulmonary artery systolic pressure. The estimated right ventricular systolic pressure is 67.3 mmHg.  4. The mitral valve is abnormal. Trivial mitral valve regurgitation.  5. Tricuspid valve regurgitation is mild to moderate.  6. The inferior vena cava is normal in size with <50% respiratory variability, suggesting right atrial pressure of 8 mmHg. Conclusion(s)/Recommendation(s): No evidence of valvular vegetations on this transthoracic echocardiogram. Would recommend a transesophageal echocardiogram to exclude infective endocarditis if clinically indicated. FINDINGS  Left Ventricle: Left ventricular ejection fraction, by estimation, is 60 to 65%. The left ventricle has normal function. The left ventricle has no regional wall motion abnormalities. Right Ventricle: The right ventricular size is normal. No increase in right ventricular wall thickness. Right ventricular systolic function is normal. There is mildly elevated pulmonary artery systolic pressure. The tricuspid regurgitant velocity is 2.80  m/s, and with an assumed right atrial pressure of 8 mmHg, the estimated right ventricular systolic pressure is 41.9 mmHg. Mitral Valve: The mitral valve is abnormal. There is mild thickening of the mitral valve leaflet(s). Trivial mitral valve regurgitation. Tricuspid Valve: The tricuspid valve is not well visualized. Tricuspid valve regurgitation is mild to moderate. Venous: The inferior vena cava is normal in size with less than 50% respiratory variability, suggesting right atrial pressure of 8 mmHg. TRICUSPID VALVE TR Peak grad:   31.4 mmHg TR Vmax:        280.00 cm/s Lyman Bishop MD Electronically signed by Lyman Bishop MD Signature Date/Time: 03/26/2020/1:30:27 PM    Final    DG Shoulder Right  Final Result    MR SHOULDER RIGHT W CONTRAST    (Results Pending)  MR THORACIC SPINE W CONTRAST    (Results Pending)  MR LUMBAR SPINE W CONTRAST    (Results Pending)    Scheduled Meds: .  aspirin EC  81 mg Oral Q M,W,F  . atorvastatin  10 mg Oral Daily  . cholecalciferol  2,000 Units Oral Daily  . docusate sodium  100 mg Oral BID  . memantine  28 mg Oral Daily   And  . donepezil  10 mg Oral Daily  . enoxaparin (LOVENOX) injection  40 mg Subcutaneous Q24H  . metoprolol succinate  25 mg Oral Daily  . multivitamin with minerals  1 tablet Oral Daily  . ramipril  2.5 mg Oral Daily  . traZODone  50 mg Oral QHS  . vitamin B-12  1,000 mcg Oral Daily   PRN Meds: acetaminophen **OR** acetaminophen,  bisacodyl, diclofenac Sodium, ondansetron **OR** ondansetron (ZOFRAN) IV, oxyCODONE, senna-docusate Continuous Infusions: . penicillin g continuous IV infusion    . pencillin G potassium IV 4 Million Units (03/26/20 1212)      LOS: 1 day  Time spent: Greater than 50% of the 35 minute visit was spent in counseling/coordination of care for the patient as laid out in the A&P.   Dwyane Dee, MD [facility] 03/26/2020, 4:14 PM  Contact via secure chat.  To contact the attending provider between 7A-7P or the covering provider during after hours 7P-7A, please log into the web site [email or web address] and access using universal [place] password for that web site. If you do not have the password, please call the hospital operator.

## 2020-03-27 ENCOUNTER — Inpatient Hospital Stay (HOSPITAL_COMMUNITY): Payer: Medicare Other

## 2020-03-27 LAB — BASIC METABOLIC PANEL
Anion gap: 8 (ref 5–15)
BUN: 11 mg/dL (ref 8–23)
CO2: 25 mmol/L (ref 22–32)
Calcium: 8.5 mg/dL — ABNORMAL LOW (ref 8.9–10.3)
Chloride: 102 mmol/L (ref 98–111)
Creatinine, Ser: 0.96 mg/dL (ref 0.61–1.24)
GFR calc Af Amer: >60 mL/min (ref >60)
GFR calc non Af Amer: >60 mL/min (ref >60)
Glucose, Bld: 126 mg/dL — ABNORMAL HIGH (ref 70–99)
Potassium: 4 mmol/L (ref 3.5–5.1)
Sodium: 135 mmol/L (ref 135–145)

## 2020-03-27 LAB — CBC WITH DIFFERENTIAL/PLATELET
Abs Immature Granulocytes: 0.02 10*3/uL (ref 0.00–0.07)
Basophils Absolute: 0 10*3/uL (ref 0.0–0.1)
Basophils Relative: 1 %
Eosinophils Absolute: 0.2 10*3/uL (ref 0.0–0.5)
Eosinophils Relative: 3 %
HCT: 43.1 % (ref 39.0–52.0)
Hemoglobin: 15.2 g/dL (ref 13.0–17.0)
Immature Granulocytes: 0 %
Lymphocytes Relative: 7 %
Lymphs Abs: 0.6 10*3/uL — ABNORMAL LOW (ref 0.7–4.0)
MCH: 32.1 pg (ref 26.0–34.0)
MCHC: 35.3 g/dL (ref 30.0–36.0)
MCV: 90.9 fL (ref 80.0–100.0)
Monocytes Absolute: 0.8 10*3/uL (ref 0.1–1.0)
Monocytes Relative: 9 %
Neutro Abs: 6.7 10*3/uL (ref 1.7–7.7)
Neutrophils Relative %: 80 %
Platelets: 150 10*3/uL (ref 150–400)
RBC: 4.74 MIL/uL (ref 4.22–5.81)
RDW: 12.7 % (ref 11.5–15.5)
WBC: 8.3 10*3/uL (ref 4.0–10.5)
nRBC: 0 % (ref 0.0–0.2)

## 2020-03-27 LAB — CULTURE, BLOOD (ROUTINE X 2): Special Requests: ADEQUATE

## 2020-03-27 LAB — MAGNESIUM: Magnesium: 2 mg/dL (ref 1.7–2.4)

## 2020-03-27 MED ORDER — HALOPERIDOL LACTATE 5 MG/ML IJ SOLN
2.0000 mg | Freq: Once | INTRAMUSCULAR | Status: AC
  Administered 2020-03-27: 2 mg via INTRAVENOUS
  Filled 2020-03-27: qty 1

## 2020-03-27 MED ORDER — GADOBUTROL 1 MMOL/ML IV SOLN
7.0000 mL | Freq: Once | INTRAVENOUS | Status: AC | PRN
  Administered 2020-03-27: 7 mL via INTRAVENOUS

## 2020-03-27 NOTE — Consult Note (Signed)
[place] [facility]ious [facility]       Reason for Consult: bacteremia    Referring Physician: Dr. Tawanna Solo  Principal Problem:   Streptococcal bacteremia Active Problems:   Coronary artery disease involving native coronary artery of native heart with angina pectoris [facility])   Essential hypertension   Dementia ([name])   Urinary incontinence   . aspirin EC  81 mg Oral Q M,W,F  . atorvastatin  10 mg Oral Daily  . cholecalciferol  2,000 Units Oral Daily  . docusate sodium  100 mg Oral BID  . memantine  28 mg Oral QAC breakfast   And  . donepezil  10 mg Oral QAC breakfast  . enoxaparin (LOVENOX) injection  40 mg Subcutaneous Q24H  . metoprolol succinate  25 mg Oral Daily  . multivitamin with minerals  1 tablet Oral Daily  . ramipril  2.5 mg Oral Daily  . traZODone  50 mg Oral QHS  . vitamin B-12  1,000 mcg Oral Daily    Recommendations: Continue penicillin MRIs as ordered   Assessment: He has bacteremia with potential sources of right shoulder infection vs back.  Continue with IV penicillin for now.   Antibiotics: penicillin  HPI: Justin Lester is a 86 y.o. male with history of dementia, prostate cancer, HTN, AAA who presented to the ED with fever, rigors, leukocytosis.  Sent home but blood culture then positive for Group B Strep. Brought back and put on antibiotics.  Feels better.  Has had about 2 weeks of back pain in the cervical/thoracic area.  Right shoulder pain more acutely for several days.  Feels better since admission.   No rash.   Review of Systems:  Constitutional: negative for fatigue and anorexia Gastrointestinal: negative for diarrhea Genitourinary: negative for frequency and dysuria All other systems reviewed and are negative    Past Medical History:  Diagnosis Date  . Dementia ([place])   . Esophageal reflux   . Old myocardial infarct   . Prostate cancer [facility])     Social History   Tobacco Use  . Smoking status: Former Research scientist (life sciences)  . Smokeless  tobacco: Never Used  Vaping Use  . Vaping Use: Never used  Substance Use Topics  . Alcohol use: No  . Drug use: No    Family History  Problem Relation Age of Onset  . CAD Mother   . CAD Sister   . CAD Brother     No Known Allergies  Physical Exam: Constitutional: in no apparent distress  Vitals:   03/27/20 0343 03/27/20 1520  BP: (!) 143/75 112/83  Pulse: (!) 59 64  Resp: 20 19  Temp: 97.8 F (36.6 C) 98.6 F (37 C)  SpO2: 94% 96%   EYES: anicteric Cardiovascular: Cor RRR Respiratory: clear; Musculoskeletal: no pedal edema noted Skin: negatives: no rash Neuro: non-focal  Lab Results  Component Value Date   WBC 8.3 03/27/2020   HGB 15.2 03/27/2020   HCT 43.1 03/27/2020   MCV 90.9 03/27/2020   PLT 150 03/27/2020    Lab Results  Component Value Date   CREATININE 0.96 03/27/2020   BUN 11 03/27/2020   NA 135 03/27/2020   K 4.0 03/27/2020   CL 102 03/27/2020   CO2 25 03/27/2020    Lab Results  Component Value Date   ALT 17 03/26/2020   AST 27 03/26/2020   ALKPHOS 48 03/26/2020     Microbiology: Recent Results (from the past 240 hour(s))  Culture, blood (Routine x 2)  Status: Abnormal   Collection Time: 03/24/20  2:41 PM   Specimen: BLOOD  Result Value Ref Range Status   Specimen Description BLOOD LEFT ANTECUBITAL  Final   Special Requests   Final    BOTTLES DRAWN AEROBIC AND ANAEROBIC Blood Culture adequate volume   Culture  Setup Time   Final    GRAM POSITIVE COCCI IN CHAINS IN BOTH AEROBIC AND ANAEROBIC BOTTLES Organism ID to follow CRITICAL RESULT CALLED TO, READ BACK BY AND VERIFIED WITH: rn at [facility] [id number] at 825 am by cm to s maynard Performed at [facility], [place] [address] [place], [place] [address]    Culture GROUP B STREP(S.AGALACTIAE)ISOLATED (A)  Final   Report Status 03/27/2020 FINAL  Final   Organism ID, Bacteria GROUP B STREP(S.AGALACTIAE)ISOLATED  Final      Susceptibility   Group b strep(s.agalactiae)isolated - MIC*      CLINDAMYCIN >=1 RESISTANT Resistant     AMPICILLIN <=0.25 SENSITIVE Sensitive     ERYTHROMYCIN >=8 RESISTANT Resistant     VANCOMYCIN 0.5 SENSITIVE Sensitive     CEFTRIAXONE <=0.12 SENSITIVE Sensitive     LEVOFLOXACIN 1 SENSITIVE Sensitive     * GROUP B STREP(S.AGALACTIAE)ISOLATED  Blood Culture ID Panel (Reflexed)     Status: Abnormal   Collection Time: 03/24/20  2:41 PM  Result Value Ref Range Status   Enterococcus faecalis NOT DETECTED NOT DETECTED Final   Enterococcus Faecium NOT DETECTED NOT DETECTED Final   Listeria monocytogenes NOT DETECTED NOT DETECTED Final   Staphylococcus species NOT DETECTED NOT DETECTED Final   Staphylococcus aureus (BCID) NOT DETECTED NOT DETECTED Final   Staphylococcus epidermidis NOT DETECTED NOT DETECTED Final   Staphylococcus lugdunensis NOT DETECTED NOT DETECTED Final   Streptococcus species DETECTED (A) NOT DETECTED Final    Comment: CRITICAL RESULT CALLED TO, READ BACK BY AND VERIFIED WITH: rn n[facility]p s maynard [id number] at 825 am by cm    Streptococcus agalactiae DETECTED (A) NOT DETECTED Final    Comment: CRITICAL RESULT CALLED TO, READ BACK BY AND VERIFIED WITH: rn [facility] s maynard [id number] at 827 am by cm    Streptococcus pneumoniae NOT DETECTED NOT DETECTED Final   Streptococcus pyogenes NOT DETECTED NOT DETECTED Final   A.calcoaceticus-baumannii NOT DETECTED NOT DETECTED Final   Bacteroides fragilis NOT DETECTED NOT DETECTED Final   Enterobacterales NOT DETECTED NOT DETECTED Final   Enterobacter cloacae complex NOT DETECTED NOT DETECTED Final   Escherichia coli NOT DETECTED NOT DETECTED Final   Klebsiella aerogenes NOT DETECTED NOT DETECTED Final   Klebsiella oxytoca NOT DETECTED NOT DETECTED Final   Klebsiella pneumoniae NOT DETECTED NOT DETECTED Final   Proteus species NOT DETECTED NOT DETECTED Final   Salmonella species NOT DETECTED NOT DETECTED Final   Serratia marcescens NOT DETECTED NOT DETECTED Final   Haemophilus influenzae NOT  DETECTED NOT DETECTED Final   Neisseria meningitidis NOT DETECTED NOT DETECTED Final   Pseudomonas aeruginosa NOT DETECTED NOT DETECTED Final   Stenotrophomonas maltophilia NOT DETECTED NOT DETECTED Final   Candida albicans NOT DETECTED NOT DETECTED Final   Candida auris NOT DETECTED NOT DETECTED Final   Candida glabrata NOT DETECTED NOT DETECTED Final   Candida krusei NOT DETECTED NOT DETECTED Final   Candida parapsilosis NOT DETECTED NOT DETECTED Final   Candida tropicalis NOT DETECTED NOT DETECTED Final   Cryptococcus neoformans/gattii NOT DETECTED NOT DETECTED Final    Comment: Performed at [facility], [address], [place],  [address]  SARS Coronavirus 2 by RT PCR (hospital order, performed in [facility] hospital lab) Nasopharyngeal Nasopharyngeal Swab     Status: None   Collection Time: 03/24/20  2:46 PM   Specimen: Nasopharyngeal Swab  Result Value Ref Range Status   SARS Coronavirus 2 NEGATIVE NEGATIVE Final    Comment: (NOTE) SARS-CoV-2 target nucleic acids are NOT DETECTED.  The SARS-CoV-2 RNA is generally detectable in upper and lower respiratory specimens during the acute phase of infection. The lowest concentration of SARS-CoV-2 viral copies this assay can detect is 250 copies / mL. A negative result does not preclude SARS-CoV-2 infection and should not be used as the sole basis for treatment or other patient management decisions.  A negative result may occur with improper specimen collection / handling, submission of specimen other than nasopharyngeal swab, presence of viral mutation(s) within the areas targeted by this assay, and inadequate number of viral copies (<250 copies / mL). A negative result must be combined with clinical observations, patient history, and epidemiological information.  Fact Sheet for Patients:   StrictlyIdeas.no  Fact Sheet for Healthcare Providers: BankingDealers.co.za  This  test is not yet approved or  cleared by the [place] FDA and has been authorized for detection and/or diagnosis of SARS-CoV-2 by FDA under an Emergency Use Authorization (EUA).  This EUA will remain in effect (meaning this test can be used) for the duration of the COVID-19 declaration under Section 564(b)(1) of the Act, 21 U.S.C. section 360bbb-3(b)(1), unless the authorization is terminated or revoked sooner.  Performed at [facility], [place] [address], [place], [place] [address]   Urine culture     Status: Abnormal   Collection Time: 03/24/20  3:29 PM   Specimen: Urine, Random  Result Value Ref Range Status   Specimen Description URINE, RANDOM  Final   Special Requests NONE  Final   Culture (A)  Final    50,000 COLONIES/mL DIPHTHEROIDS(CORYNEBACTERIUM SPECIES) Standardized susceptibility testing for this organism is not available. Performed at [facility], [place] [address] [place], [place] [address]    Report Status 03/26/2020 FINAL  Final  Culture, blood (Routine x 2)     Status: None (Preliminary result)   Collection Time: 03/24/20  4:34 PM   Specimen: BLOOD RIGHT HAND  Result Value Ref Range Status   Specimen Description BLOOD RIGHT HAND  Final   Special Requests   Final    BOTTLES DRAWN AEROBIC AND ANAEROBIC Blood Culture results may not be optimal due to an inadequate volume of blood received in culture bottles   Culture   Final    NO GROWTH 3 DAYS Performed at [facility], [place] [address], [place], [place] [address]    Report Status PENDING  Incomplete  Urine culture     Status: None   Collection Time: 03/25/20  1:46 PM   Specimen: Urine, Random  Result Value Ref Range Status   Specimen Description   Final    URINE, RANDOM Performed at [place] [address], [place], [place] [address]    Special Requests   Final    NONE Performed at [facility], [place] [address], [place], [place] [address]     Culture   Final    NO GROWTH Performed at [facility], [place] [address], [place], [place] [address]    Report Status 03/26/2020 FINAL  Final  Blood culture (routine x 2)     Status: None (Preliminary  result)   Collection Time: 03/25/20  1:46 PM   Specimen: BLOOD  Result Value Ref Range Status   Specimen Description   Final    BLOOD BLOOD RIGHT HAND Performed at [facility], [place] [address], [place], [place] [address]    Special Requests   Final    BOTTLES DRAWN AEROBIC ONLY Blood Culture adequate volume Performed at [place] [address] [place], [place] [address]    Culture   Final    NO GROWTH 2 DAYS Performed at [place] [address], [place], [place] [address]    Report Status PENDING  Incomplete  Blood culture (routine x 2)     Status: None (Preliminary result)   Collection Time: 03/25/20  1:51 PM   Specimen: BLOOD RIGHT ARM  Result Value Ref Range Status   Specimen Description   Final    BLOOD RIGHT ARM Performed at [facility], [place] [address] [place], [place] [address]    Special Requests   Final    BOTTLES DRAWN AEROBIC AND ANAEROBIC Blood Culture adequate volume Performed at [place] [address], [place], [place] [address]    Culture   Final    NO GROWTH 2 DAYS Performed at [place] [address] [place], [place] [address]    Report Status PENDING  Incomplete  SARS Coronavirus 2 by RT PCR (hospital order, performed in [facility] hospital lab) Nasopharyngeal Nasopharyngeal Swab     Status: None   Collection Time: 03/25/20  4:19 PM   Specimen: Nasopharyngeal Swab  Result Value Ref Range Status   SARS Coronavirus 2 NEGATIVE NEGATIVE Final    Comment: (NOTE) SARS-CoV-2 target nucleic acids are NOT DETECTED.  The SARS-CoV-2 RNA is generally detectable in upper and lower respiratory specimens during the acute phase of infection. The lowest concentration  of SARS-CoV-2 viral copies this assay can detect is 250 copies / mL. A negative result does not preclude SARS-CoV-2 infection and should not be used as the sole basis for treatment or other patient management decisions.  A negative result may occur with improper specimen collection / handling, submission of specimen other than nasopharyngeal swab, presence of viral mutation(s) within the areas targeted by this assay, and inadequate number of viral copies (<250 copies / mL). A negative result must be combined with clinical observations, patient history, and epidemiological information.  Fact Sheet for Patients:   StrictlyIdeas.no  Fact Sheet for Healthcare Providers: BankingDealers.co.za  This test is not yet approved or  cleared by the [place] FDA and has been authorized for detection and/or diagnosis of SARS-CoV-2 by FDA under an Emergency Use Authorization (EUA).  This EUA will remain in effect (meaning this test can be used) for the duration of the COVID-19 declaration under Section 564(b)(1) of the Act, 21 U.S.C. section 360bbb-3(b)(1), unless the authorization is terminated or revoked sooner.  Performed at [facility], [place] [address], [place], [place] [address]     Keiandra Sullenger W Lani Mendiola, MD [facility]ious [facility] [email or web address] 03/27/2020, 3:26 PM

## 2020-03-27 NOTE — Evaluation (Signed)
Physical Therapy Evaluation Patient Details Name: Justin Lester MRN: [id number] DOB: 02-Oct-1930 Today's Date: 03/27/2020   History of Present Illness  86 yo male admitted with bacteremia, R shoulder pain, back pain. Hx of Alz, prostate ca, RBBB, AAA  Clinical Impression  On eval, pt was Min guard-Min assist for mobility. He walked ~350 feet around the unit. Intermittent assistance to steady, especially with initial standing and for first 15 feet of ambulation. Pt's sister was in the room(wife not present). Will plan to follow and progress activity as tolerated.     Follow Up Recommendations Supervision/Assistance - 24 hour (HHPT if wife decides she wants PT f/u)    Equipment Recommendations  None recommended by PT    Recommendations for Other Services       Precautions / Restrictions Precautions Precautions: Fall      Mobility  Bed Mobility               General bed mobility comments: oob in recliner  Transfers Overall transfer level: Needs assistance   Transfers: Sit to/from Stand Sit to Stand: Min assist         General transfer comment: Assist to steady initially.  Ambulation/Gait Ambulation/Gait assistance: Min guard Gait Distance (Feet): 350 Feet Assistive device: None Gait Pattern/deviations: Step-through pattern;Decreased stride length     General Gait Details: Min guard for safety.  Stairs            Wheelchair Mobility    Modified Rankin (Stroke Patients Only)       Balance Overall balance assessment: Needs assistance         Standing balance support: No upper extremity supported Standing balance-Leahy Scale: Fair                               Pertinent Vitals/Pain Pain Assessment: No/denies pain    Home Living Family/patient expects to be discharged to:: Private residence Living Arrangements: Spouse/significant other Available Help at Discharge: Family Type of Home: House Home Access: Stairs to enter    Technical brewer of Steps: 1 Home Layout: Bed/bath upstairs Home Equipment: None      Prior Function Level of Independence: Independent               Hand Dominance        Extremity/Trunk Assessment   Upper Extremity Assessment Upper Extremity Assessment: Overall WFL for tasks assessed    Lower Extremity Assessment Lower Extremity Assessment: Generalized weakness    Cervical / Trunk Assessment Cervical / Trunk Assessment: Normal  Communication   Communication: No difficulties  Cognition Arousal/Alertness: Awake/alert Behavior During Therapy: WFL for tasks assessed/performed Overall Cognitive Status: History of cognitive impairments - at baseline                                        General Comments      Exercises     Assessment/Plan    PT Assessment Patient needs continued PT services  PT Problem List Decreased mobility;Decreased balance       PT Treatment Interventions Gait training;Therapeutic activities;Therapeutic exercise;Patient/family education;Balance training;Functional mobility training    PT Goals (Current goals can be found in the Care Plan section)  Acute Rehab PT Goals Patient Stated Goal: none stated by pt PT Goal Formulation: Patient unable to participate in goal setting Time For Goal Achievement: 04/10/20 Potential to  Achieve Goals: Good    Frequency Min 3X/week   Barriers to discharge        Co-evaluation               AM-PAC PT "6 Clicks" Mobility  Outcome Measure Help needed turning from your back to your side while in a flat bed without using bedrails?: A Little Help needed moving from lying on your back to sitting on the side of a flat bed without using bedrails?: A Little Help needed moving to and from a bed to a chair (including a wheelchair)?: A Little Help needed standing up from a chair using your arms (e.g., wheelchair or bedside chair)?: A Little Help needed to walk in hospital  room?: A Little Help needed climbing 3-5 steps with a railing? : A Little 6 Click Score: 18    End of Session Equipment Utilized During Treatment: Gait belt Activity Tolerance: Patient tolerated treatment well Patient left: in chair;with call bell/phone within reach;with family/visitor present   PT Visit Diagnosis: Unsteadiness on feet (R26.81)    Time: 0479-9872 PT Time Calculation (min) (ACUTE ONLY): 11 min   Charges:   PT Evaluation $PT Eval Low Complexity: 1 Low             Doreatha Massed, PT Acute Rehabilitation  Office: [phone] Pager: [phone]

## 2020-03-27 NOTE — Progress Notes (Signed)
PROGRESS NOTE    Justin Lester  VFI:[id number] DOB: 06-23-1931 DOA: 03/25/2020 PCP: [name], MD   Brief Narrative:  Patient is 86 year old male with history of Alzheimer's dementia, prostate cancer, hypertension, abdominal aortic aneurysm, urinary incontinence who was admitted here because of positive blood cultures.  Patient actually presented to the [facility] emergency department with fever of 103 F, chills and rigors, lactic acidosis, leukocytosis on 03/24/2020.  He was sent home from the emergency department after initial stabilization.  Blood cultures sent that day came out to be positive for Streptococcus agalactiae and he had to be called back for admission.  Even after discharge, he had fever, chills and worsening confusion at home.  Patient was complaining of right shoulder pain and back pain on presentation.  Right shoulder x-ray did not show any significant finding.  He has been started on penicillin..  Blood cultures have been negative and currently he is afebrile.  ID consulted today.  Plan is to obtain MRI of the back and right shoulder today.  No source of bacteremia yet.  Assessment & Plan:   Principal Problem:   Streptococcal bacteremia Active Problems:   Coronary artery disease involving native coronary artery of native heart with angina pectoris (HCC)   Essential hypertension   Dementia (HCC)   Urinary incontinence   Streptococcal agalactiae bacteremia: No clear source of infection.  Currently afebrile.  Initially presented with fever, chills, leukocytosis, lactic acidosis.  Blood culture sent on 8/21 showed streptococcal agalactiae. Patient has been started on penicillin here.  Currently afebrile.  Repeat blood cultures have been negative so far. ID consulted today.  Right shoulder pain/back pain: Low suspicious for infection on these sites but we are doing MRI of the right shoulder and back.  Will request for PT/OT evaluation.  Patient lives with his family  and has difficulty with ambulation.  Continue pain management, supportive care  Dementia: Confused at times but currently alert oriented.  Continue home medications.  Coronary artery disease: Currently stable.  Continue home medications  Urinary incontinence: Chronic.  Continue condom catheter  Hypertension: Currently blood pressure stable.  Continue home medications.         DVT prophylaxis: Lovenox Code Status: DNR Family Communication: None at bedside Status is: Inpatient  Remains inpatient appropriate because:IV treatments appropriate due to intensity of illness or inability to take PO   Dispo: The patient is from: Home              Anticipated d/c is to: Home              Anticipated d/c date is: 2 days              Patient currently is not medically stable to d/c.    Consultants: ID  Procedures:None  Antimicrobials:  Anti-infectives (From admission, onward)   Start     Dose/Rate Route Frequency Ordered Stop   03/26/20 2200  penicillin G potassium 10 Million Units in dextrose 5 % 500 mL continuous infusion        10 Million Units 41.7 mL/hr over 12 Hours Intravenous Every 12 hours 03/26/20 1111     03/26/20 0000  penicillin G potassium 4 Million Units in dextrose 5 % 250 mL IVPB        4 Million Units 250 mL/hr over 60 Minutes Intravenous Every 6 hours 03/25/20 2127 03/26/20 1834   03/25/20 1600  vancomycin (VANCOREADY) IVPB 1500 mg/300 mL        1,500  mg 150 mL/hr over 120 Minutes Intravenous  Once 03/25/20 1536 03/25/20 2113   03/25/20 1530  cefTRIAXone (ROCEPHIN) 1 g in sodium chloride 0.9 % 100 mL IVPB        1 g 200 mL/hr over 30 Minutes Intravenous  Once 03/25/20 1522 03/25/20 1911      Subjective: Patient seen and examined the bedside this morning.  Currently hemodynamically stable but comfortable.  Denies any complaints today except for mild right shoulder pain.  He denies any back pain today.  He was in good mood.  Objective: Vitals:   03/26/20  1036 03/26/20 1426 03/26/20 2143 03/27/20 0343  BP: (!) 143/69 130/86 (!) 151/79 (!) 143/75  Pulse: (!) 50 60 (!) 59 (!) 59  Resp: 14 16 20 20   Temp: 97.8 F (36.6 C) 98.1 F (36.7 C) 98.7 F (37.1 C) 97.8 F (36.6 C)  TempSrc: Oral Oral Oral Oral  SpO2: (!) 78% (!) 74% 97% 94%  Weight:      Height:        Intake/Output Summary (Last 24 hours) at 03/27/2020 1050 Last data filed at 03/27/2020 0900 Gross per 24 hour  Intake 458.69 ml  Output 2200 ml  Net -1741.31 ml   Filed Weights   03/25/20 1138  Weight: 68 kg    Examination:  General exam: Pleasant elderly gentleman HEENT:PERRL,Oral mucosa moist, Ear/Nose normal on gross exam Respiratory system: Bilateral equal air entry, normal vesicular breath sounds, no wheezes or crackles  Cardiovascular system: S1 & S2 heard, RRR. No JVD, murmurs, rubs, gallops or clicks. No pedal edema. Gastrointestinal system: Abdomen is nondistended, soft and nontender. No organomegaly or masses felt. Normal bowel sounds heard. Central nervous system: Alert and oriented. No focal neurological deficits. Extremities: No edema, no clubbing ,no cyanosis Skin: No rashes, lesions or ulcers,no icterus ,no pallor    Data Reviewed: I have personally reviewed following labs and imaging studies  CBC: Recent Labs  Lab 03/24/20 0928 03/25/20 1346 03/26/20 0915 03/27/20 0758  WBC 13.9* 17.6* 10.8* 8.3  NEUTROABS 13.0*  --   --  6.7  HGB 15.5 14.9 14.3 15.2  HCT 46.3 45.2 41.6 43.1  MCV 91.9 94.8 92.7 90.9  PLT 165 132* 136* 974   Basic Metabolic Panel: Recent Labs  Lab 03/24/20 0928 03/25/20 1346 03/26/20 0915 03/27/20 0758  NA 144 141 140 135  K 3.9 4.2 3.6 4.0  CL 110 108 108 102  CO2 21* 24 24 25   GLUCOSE 146* 132* 110* 126*  BUN 20 20 15 11   CREATININE 1.19 1.11 1.03 0.96  CALCIUM 9.1 8.9 8.4* 8.5*  MG  --   --   --  2.0   GFR: Estimated Creatinine Clearance: 50.7 mL/min (by C-G formula based on SCr of 0.96 mg/dL). Liver  Function Tests: Recent Labs  Lab 03/24/20 0928 03/25/20 1346 03/26/20 0915  AST 23 31 27   ALT 19 19 17   ALKPHOS 63 54 48  BILITOT 1.3* 1.0 0.8  PROT 6.4* 6.6 5.8*  ALBUMIN 4.1 4.0 3.4*   No results for input(s): LIPASE, AMYLASE in the last 168 hours. No results for input(s): AMMONIA in the last 168 hours. Coagulation Profile: Recent Labs  Lab 03/24/20 1441  INR 1.1   Cardiac Enzymes: No results for input(s): CKTOTAL, CKMB, CKMBINDEX, TROPONINI in the last 168 hours. BNP (last 3 results) No results for input(s): PROBNP in the last 8760 hours. HbA1C: No results for input(s): HGBA1C in the last 72 hours. CBG: No results  for input(s): GLUCAP in the last 168 hours. Lipid Profile: No results for input(s): CHOL, HDL, LDLCALC, TRIG, CHOLHDL, LDLDIRECT in the last 72 hours. Thyroid Function Tests: No results for input(s): TSH, T4TOTAL, FREET4, T3FREE, THYROIDAB in the last 72 hours. Anemia Panel: No results for input(s): VITAMINB12, FOLATE, FERRITIN, TIBC, IRON, RETICCTPCT in the last 72 hours. Sepsis Labs: Recent Labs  Lab 03/24/20 9233 03/24/20 1441 03/25/20 1346 03/25/20 1546  LATICACIDVEN 2.0* 2.3* 2.1* 1.7    Recent Results (from the past 240 hour(s))  Culture, blood (Routine x 2)     Status: Abnormal   Collection Time: 03/24/20  2:41 PM   Specimen: BLOOD  Result Value Ref Range Status   Specimen Description BLOOD LEFT ANTECUBITAL  Final   Special Requests   Final    BOTTLES DRAWN AEROBIC AND ANAEROBIC Blood Culture adequate volume   Culture  Setup Time   Final    GRAM POSITIVE COCCI IN CHAINS IN BOTH AEROBIC AND ANAEROBIC BOTTLES Organism ID to follow CRITICAL RESULT CALLED TO, READ BACK BY AND VERIFIED WITH: rn at [facility] [id number] at 825 am by cm to s maynard Performed at [facility], [place] [address] [place], [place] [address]    Culture GROUP B STREP(S.AGALACTIAE)ISOLATED (A)  Final   Report Status 03/27/2020 FINAL  Final   Organism ID, Bacteria GROUP B  STREP(S.AGALACTIAE)ISOLATED  Final      Susceptibility   Group b strep(s.agalactiae)isolated - MIC*    CLINDAMYCIN >=1 RESISTANT Resistant     AMPICILLIN <=0.25 SENSITIVE Sensitive     ERYTHROMYCIN >=8 RESISTANT Resistant     VANCOMYCIN 0.5 SENSITIVE Sensitive     CEFTRIAXONE <=0.12 SENSITIVE Sensitive     LEVOFLOXACIN 1 SENSITIVE Sensitive     * GROUP B STREP(S.AGALACTIAE)ISOLATED  Blood Culture ID Panel (Reflexed)     Status: Abnormal   Collection Time: 03/24/20  2:41 PM  Result Value Ref Range Status   Enterococcus faecalis NOT DETECTED NOT DETECTED Final   Enterococcus Faecium NOT DETECTED NOT DETECTED Final   Listeria monocytogenes NOT DETECTED NOT DETECTED Final   Staphylococcus species NOT DETECTED NOT DETECTED Final   Staphylococcus aureus (BCID) NOT DETECTED NOT DETECTED Final   Staphylococcus epidermidis NOT DETECTED NOT DETECTED Final   Staphylococcus lugdunensis NOT DETECTED NOT DETECTED Final   Streptococcus species DETECTED (A) NOT DETECTED Final    Comment: CRITICAL RESULT CALLED TO, READ BACK BY AND VERIFIED WITH: rn nchp s maynard [id number] at 825 am by cm    Streptococcus agalactiae DETECTED (A) NOT DETECTED Final    Comment: CRITICAL RESULT CALLED TO, READ BACK BY AND VERIFIED WITH: rn mchp s maynard [id number] at 827 am by cm    Streptococcus pneumoniae NOT DETECTED NOT DETECTED Final   Streptococcus pyogenes NOT DETECTED NOT DETECTED Final   A.calcoaceticus-baumannii NOT DETECTED NOT DETECTED Final   Bacteroides fragilis NOT DETECTED NOT DETECTED Final   Enterobacterales NOT DETECTED NOT DETECTED Final   Enterobacter cloacae complex NOT DETECTED NOT DETECTED Final   Escherichia coli NOT DETECTED NOT DETECTED Final   Klebsiella aerogenes NOT DETECTED NOT DETECTED Final   Klebsiella oxytoca NOT DETECTED NOT DETECTED Final   Klebsiella pneumoniae NOT DETECTED NOT DETECTED Final   Proteus species NOT DETECTED NOT DETECTED Final   Salmonella species NOT DETECTED NOT  DETECTED Final   Serratia marcescens NOT DETECTED NOT DETECTED Final   Haemophilus influenzae NOT DETECTED NOT DETECTED Final   Neisseria meningitidis NOT DETECTED NOT DETECTED Final  Pseudomonas aeruginosa NOT DETECTED NOT DETECTED Final   Stenotrophomonas maltophilia NOT DETECTED NOT DETECTED Final   Candida albicans NOT DETECTED NOT DETECTED Final   Candida auris NOT DETECTED NOT DETECTED Final   Candida glabrata NOT DETECTED NOT DETECTED Final   Candida krusei NOT DETECTED NOT DETECTED Final   Candida parapsilosis NOT DETECTED NOT DETECTED Final   Candida tropicalis NOT DETECTED NOT DETECTED Final   Cryptococcus neoformans/gattii NOT DETECTED NOT DETECTED Final    Comment: Performed at [facility], [place] [address] [place], [place] [address]  SARS Coronavirus 2 by RT PCR (hospital order, performed in [facility] hospital lab) Nasopharyngeal Nasopharyngeal Swab     Status: None   Collection Time: 03/24/20  2:46 PM   Specimen: Nasopharyngeal Swab  Result Value Ref Range Status   SARS Coronavirus 2 NEGATIVE NEGATIVE Final    Comment: (NOTE) SARS-CoV-2 target nucleic acids are NOT DETECTED.  The SARS-CoV-2 RNA is generally detectable in upper and lower respiratory specimens during the acute phase of infection. The lowest concentration of SARS-CoV-2 viral copies this assay can detect is 250 copies / mL. A negative result does not preclude SARS-CoV-2 infection and should not be used as the sole basis for treatment or other patient management decisions.  A negative result may occur with improper specimen collection / handling, submission of specimen other than nasopharyngeal swab, presence of viral mutation(s) within the areas targeted by this assay, and inadequate number of viral copies (<250 copies / mL). A negative result must be combined with clinical observations, patient history, and epidemiological information.  Fact Sheet for Patients:     StrictlyIdeas.no  Fact Sheet for Healthcare Providers: BankingDealers.co.za  This test is not yet approved or  cleared by the [place] FDA and has been authorized for detection and/or diagnosis of SARS-CoV-2 by FDA under an Emergency Use Authorization (EUA).  This EUA will remain in effect (meaning this test can be used) for the duration of the COVID-19 declaration under Section 564(b)(1) of the Act, 21 U.S.C. section 360bbb-3(b)(1), unless the authorization is terminated or revoked sooner.  Performed at [facility], [place] [address], [place], [place] [address]   Urine culture     Status: Abnormal   Collection Time: 03/24/20  3:29 PM   Specimen: Urine, Random  Result Value Ref Range Status   Specimen Description URINE, RANDOM  Final   Special Requests NONE  Final   Culture (A)  Final    50,000 COLONIES/mL DIPHTHEROIDS(CORYNEBACTERIUM SPECIES) Standardized susceptibility testing for this organism is not available. Performed at [facility], [place] [address] [place] [address] [place], [place] [address]    Report Status 03/26/2020 FINAL  Final  Culture, blood (Routine x 2)     Status: None (Preliminary result)   Collection Time: 03/24/20  4:34 PM   Specimen: BLOOD RIGHT HAND  Result Value Ref Range Status   Specimen Description BLOOD RIGHT HAND  Final   Special Requests   Final    BOTTLES DRAWN AEROBIC AND ANAEROBIC Blood Culture results may not be optimal due to an inadequate volume of blood received in culture bottles   Culture   Final    NO GROWTH 3 DAYS Performed at [facility], [place] [address] [place], [place] [address]    Report Status PENDING  Incomplete  Urine culture     Status: None   Collection Time: 03/25/20  1:46 PM   Specimen: Urine, Random  Result Value Ref Range Status  Specimen Description   Final    URINE, RANDOM Performed at [facility], [place] [address] [place], [place] [address]     Special Requests   Final    NONE Performed at [facility], [place] [address] [place], [place] [address]    Culture   Final    NO GROWTH Performed at [facility], [place] [address] [place] [address] [place], [place] [address]    Report Status 03/26/2020 FINAL  Final  Blood culture (routine x 2)     Status: None (Preliminary result)   Collection Time: 03/25/20  1:46 PM   Specimen: BLOOD  Result Value Ref Range Status   Specimen Description   Final    BLOOD BLOOD RIGHT HAND Performed at [place] [address] [place], [place] [address]    Special Requests   Final    BOTTLES DRAWN AEROBIC ONLY Blood Culture adequate volume Performed at [place] [address] [place], [place] [address]    Culture   Final    NO GROWTH 2 DAYS Performed at [place] [address] [place], [place] [address]    Report Status PENDING  Incomplete  Blood culture (routine x 2)     Status: None (Preliminary result)   Collection Time: 03/25/20  1:51 PM   Specimen: BLOOD RIGHT ARM  Result Value Ref Range Status   Specimen Description   Final    BLOOD RIGHT ARM Performed at [facility], [place] [address], [place], [place] [address]    Special Requests   Final    BOTTLES DRAWN AEROBIC AND ANAEROBIC Blood Culture adequate volume Performed at [place] [address], [place],  [address]    Culture   Final    NO GROWTH 2 DAYS Performed at [place] [address], [place],  [address]    Report Status PENDING  Incomplete  SARS Coronavirus 2 by RT PCR (hospital order, performed in [facility] hospital lab) Nasopharyngeal Nasopharyngeal Swab     Status: None   Collection Time: 03/25/20  4:19 PM   Specimen: Nasopharyngeal Swab  Result Value Ref Range Status   SARS Coronavirus 2 NEGATIVE NEGATIVE Final    Comment: (NOTE) SARS-CoV-2 target nucleic acids are NOT DETECTED.  The SARS-CoV-2  RNA is generally detectable in upper and lower respiratory specimens during the acute phase of infection. The lowest concentration of SARS-CoV-2 viral copies this assay can detect is 250 copies / mL. A negative result does not preclude SARS-CoV-2 infection and should not be used as the sole basis for treatment or other patient management decisions.  A negative result may occur with improper specimen collection / handling, submission of specimen other than nasopharyngeal swab, presence of viral mutation(s) within the areas targeted by this assay, and inadequate number of viral copies (<250 copies / mL). A negative result must be combined with clinical observations, patient history, and epidemiological information.  Fact Sheet for Patients:   StrictlyIdeas.no  Fact Sheet for Healthcare Providers: BankingDealers.co.za  This test is not yet approved or  cleared by the [place] FDA and has been authorized for detection and/or diagnosis of SARS-CoV-2 by FDA under an Emergency Use Authorization (EUA).  This EUA will remain in effect (meaning this test can be used) for the duration of the COVID-19 declaration under Section 564(b)(1) of the Act, 21 U.S.C. section 360bbb-3(b)(1), unless the authorization is terminated or revoked sooner.  Performed at [facility], [place] [address], [place], [place] [address]          Radiology Studies: DG Shoulder Right  Result Date: 03/25/2020 CLINICAL DATA:  Fever and pain EXAM: RIGHT SHOULDER - 2+ VIEW COMPARISON:  None. FINDINGS: There is no evidence of fracture or dislocation. There is no evidence of arthropathy or other focal bone abnormality. Soft tissues are unremarkable. IMPRESSION: Negative. Electronically Signed   By: Dorise Bullion III M.D   On: 03/25/2020 15:50   ECHOCARDIOGRAM LIMITED  Result Date: 03/26/2020    ECHOCARDIOGRAM LIMITED REPORT   Patient Name:   BRADYN SOWARD Date of Exam: 03/26/2020 Medical Rec #:  [id number]    Height:       67.0 in Accession #:    [id number]   Weight:       150.0 lb Date of Birth:  1931-01-12    BSA:          1.790 m Patient Age:    32 years     BP:           127/68 mmHg Patient Gender: M            HR:           59 bpm. Exam Location:  Inpatient Procedure: Limited Echo, Color Doppler and Cardiac Doppler Indications:    Bacteremia  History:        Patient has no prior history of Echocardiogram examinations.                 Signs/Symptoms:Alzheimer's; Risk Factors:Hypertension and                 Dyslipidemia.  Sonographer:    Raquel Sarna Senior RDCS Referring Phys: [place]  Sonographer Comments: Technically difficult due to poor echo windows. IMPRESSIONS  1. Cardiac valves are suboptimally visulized to exclude endocarditis.  2. Left ventricular ejection fraction, by estimation, is 60 to 65%. The left ventricle has normal function. The left ventricle has no regional wall motion abnormalities.  3. Right ventricular systolic function is normal. The right ventricular size is normal. There is mildly elevated pulmonary artery systolic pressure. The estimated right ventricular systolic pressure is 61.4 mmHg.  4. The mitral valve is abnormal. Trivial mitral valve regurgitation.  5. Tricuspid valve regurgitation is mild to moderate.  6. The inferior vena cava is normal in size with <50% respiratory variability, suggesting right atrial pressure of 8 mmHg. Conclusion(s)/Recommendation(s): No evidence of valvular vegetations on this transthoracic echocardiogram. Would recommend a transesophageal echocardiogram to exclude infective endocarditis if clinically indicated. FINDINGS  Left Ventricle: Left ventricular ejection fraction, by estimation, is 60 to 65%. The left ventricle has normal function. The left ventricle has no regional wall motion abnormalities. Right Ventricle: The right ventricular size is normal. No increase in right ventricular wall  thickness. Right ventricular systolic function is normal. There is mildly elevated pulmonary artery systolic pressure. The tricuspid regurgitant velocity is 2.80  m/s, and with an assumed right atrial pressure of 8 mmHg, the estimated right ventricular systolic pressure is 43.1 mmHg. Mitral Valve: The mitral valve is abnormal. There is mild thickening of the mitral valve leaflet(s). Trivial mitral valve regurgitation. Tricuspid Valve: The tricuspid valve is not well visualized. Tricuspid valve regurgitation is mild to moderate. Venous: The inferior vena cava is normal in size with less than 50% respiratory variability, suggesting right atrial pressure of 8 mmHg. TRICUSPID VALVE TR Peak grad:   31.4 mmHg TR Vmax:  280.00 cm/s Lyman Bishop MD Electronically signed by Lyman Bishop MD Signature Date/Time: 03/26/2020/1:30:27 PM    Final         Scheduled Meds: . aspirin EC  81 mg Oral Q M,W,F  . atorvastatin  10 mg Oral Daily  . cholecalciferol  2,000 Units Oral Daily  . docusate sodium  100 mg Oral BID  . memantine  28 mg Oral QAC breakfast   And  . donepezil  10 mg Oral QAC breakfast  . enoxaparin (LOVENOX) injection  40 mg Subcutaneous Q24H  . metoprolol succinate  25 mg Oral Daily  . multivitamin with minerals  1 tablet Oral Daily  . ramipril  2.5 mg Oral Daily  . traZODone  50 mg Oral QHS  . vitamin B-12  1,000 mcg Oral Daily   Continuous Infusions: . penicillin g continuous IV infusion 10 Million Units (03/27/20 1049)     LOS: 2 days    Time spent:25 mins, More than 50% of that time was spent in counseling and/or coordination of care.      Shelly Coss, MD [facility] P8/24/2021, 10:50 AM

## 2020-03-28 LAB — CBC WITH DIFFERENTIAL/PLATELET
Abs Immature Granulocytes: 0.04 10*3/uL (ref 0.00–0.07)
Basophils Absolute: 0 10*3/uL (ref 0.0–0.1)
Basophils Relative: 1 %
Eosinophils Absolute: 0.4 10*3/uL (ref 0.0–0.5)
Eosinophils Relative: 5 %
HCT: 47.4 % (ref 39.0–52.0)
Hemoglobin: 16.3 g/dL (ref 13.0–17.0)
Immature Granulocytes: 1 %
Lymphocytes Relative: 11 %
Lymphs Abs: 0.9 10*3/uL (ref 0.7–4.0)
MCH: 31.2 pg (ref 26.0–34.0)
MCHC: 34.4 g/dL (ref 30.0–36.0)
MCV: 90.8 fL (ref 80.0–100.0)
Monocytes Absolute: 1 10*3/uL (ref 0.1–1.0)
Monocytes Relative: 11 %
Neutro Abs: 6.2 10*3/uL (ref 1.7–7.7)
Neutrophils Relative %: 71 %
Platelets: 172 10*3/uL (ref 150–400)
RBC: 5.22 MIL/uL (ref 4.22–5.81)
RDW: 12.5 % (ref 11.5–15.5)
WBC: 8.6 10*3/uL (ref 4.0–10.5)
nRBC: 0 % (ref 0.0–0.2)

## 2020-03-28 LAB — BASIC METABOLIC PANEL
Anion gap: 9 (ref 5–15)
BUN: 12 mg/dL (ref 8–23)
CO2: 27 mmol/L (ref 22–32)
Calcium: 9.1 mg/dL (ref 8.9–10.3)
Chloride: 103 mmol/L (ref 98–111)
Creatinine, Ser: 1.08 mg/dL (ref 0.61–1.24)
GFR calc Af Amer: >60 mL/min (ref >60)
GFR calc non Af Amer: >60 mL/min (ref >60)
Glucose, Bld: 117 mg/dL — ABNORMAL HIGH (ref 70–99)
Potassium: 3.7 mmol/L (ref 3.5–5.1)
Sodium: 139 mmol/L (ref 135–145)

## 2020-03-28 LAB — MAGNESIUM: Magnesium: 2 mg/dL (ref 1.7–2.4)

## 2020-03-28 MED ORDER — ACETAMINOPHEN 325 MG PO TABS: 650.0000 mg | ORAL_TABLET | Freq: Four times a day (QID) | ORAL | 1 refills | Status: DC | PRN

## 2020-03-28 MED ORDER — DICLOFENAC SODIUM 1 % EX GEL: 2.0000 g | Freq: Four times a day (QID) | CUTANEOUS | 1 refills | Status: DC | PRN

## 2020-03-28 MED ORDER — AMOXICILLIN 500 MG PO CAPS: 500.0000 mg | ORAL_CAPSULE | Freq: Three times a day (TID) | ORAL | 0 refills | Status: AC

## 2020-03-28 MED ORDER — AMOXICILLIN 250 MG PO CAPS: 500.0000 mg | ORAL_CAPSULE | Freq: Three times a day (TID) | ORAL | Status: DC

## 2020-03-28 NOTE — Progress Notes (Signed)
[facility]   Reason for visit: Follow up on bacteremia  Interval History: MRI negative for infection of spine or shoulder.  He feels well.  No new complaints. WBC wnl and remains afebrile.    Physical Exam: Constitutional:  Vitals:   03/28/20 0433 03/28/20 1352  BP: (!) 166/98 (!) 173/82  Pulse: 65 65  Resp: 18 16  Temp:  97.7 F (36.5 C)  SpO2: 93% 95%   patient appears in NAD Respiratory: Normal respiratory effort; CTA B Cardiovascular: RRR   Review of Systems: Constitutional: negative for fevers and chills Gastrointestinal: negative for diarrhea  Lab Results  Component Value Date   WBC 8.6 03/28/2020   HGB 16.3 03/28/2020   HCT 47.4 03/28/2020   MCV 90.8 03/28/2020   PLT 172 03/28/2020    Lab Results  Component Value Date   CREATININE 1.08 03/28/2020   BUN 12 03/28/2020   NA 139 03/28/2020   K 3.7 03/28/2020   CL 103 03/28/2020   CO2 27 03/28/2020    Lab Results  Component Value Date   ALT 17 03/26/2020   AST 27 03/26/2020   ALKPHOS 48 03/26/2020     Microbiology: Recent Results (from the past 240 hour(s))  Culture, blood (Routine x 2)     Status: Abnormal   Collection Time: 03/24/20  2:41 PM   Specimen: BLOOD  Result Value Ref Range Status   Specimen Description BLOOD LEFT ANTECUBITAL  Final   Special Requests   Final    BOTTLES DRAWN AEROBIC AND ANAEROBIC Blood Culture adequate volume   Culture  Setup Time   Final    GRAM POSITIVE COCCI IN CHAINS IN BOTH AEROBIC AND ANAEROBIC BOTTLES Organism ID to follow CRITICAL RESULT CALLED TO, READ BACK BY AND VERIFIED WITH: rn at [facility] [id number] at 825 am by cm to s maynard Performed at [facility], [address], [place], [place] [address]    Culture GROUP B STREP(S.AGALACTIAE)ISOLATED (A)  Final   Report Status 03/27/2020 FINAL  Final   Organism ID, Bacteria GROUP B STREP(S.AGALACTIAE)ISOLATED  Final      Susceptibility   Group b strep(s.agalactiae)isolated - MIC*      CLINDAMYCIN >=1 RESISTANT Resistant     AMPICILLIN <=0.25 SENSITIVE Sensitive     ERYTHROMYCIN >=8 RESISTANT Resistant     VANCOMYCIN 0.5 SENSITIVE Sensitive     CEFTRIAXONE <=0.12 SENSITIVE Sensitive     LEVOFLOXACIN 1 SENSITIVE Sensitive     * GROUP B STREP(S.AGALACTIAE)ISOLATED  Blood Culture ID Panel (Reflexed)     Status: Abnormal   Collection Time: 03/24/20  2:41 PM  Result Value Ref Range Status   Enterococcus faecalis NOT DETECTED NOT DETECTED Final   Enterococcus Faecium NOT DETECTED NOT DETECTED Final   Listeria monocytogenes NOT DETECTED NOT DETECTED Final   Staphylococcus species NOT DETECTED NOT DETECTED Final   Staphylococcus aureus (BCID) NOT DETECTED NOT DETECTED Final   Staphylococcus epidermidis NOT DETECTED NOT DETECTED Final   Staphylococcus lugdunensis NOT DETECTED NOT DETECTED Final   Streptococcus species DETECTED (A) NOT DETECTED Final    Comment: CRITICAL RESULT CALLED TO, READ BACK BY AND VERIFIED WITH: rn [facility] s maynard [id number] at 825 am by cm    Streptococcus agalactiae DETECTED (A) NOT DETECTED Final    Comment: CRITICAL RESULT CALLED TO, READ BACK BY AND VERIFIED WITH: rn [facility] s maynard [id number] at 827 am by cm    Streptococcus pneumoniae NOT DETECTED NOT DETECTED Final  Streptococcus pyogenes NOT DETECTED NOT DETECTED Final   A.calcoaceticus-baumannii NOT DETECTED NOT DETECTED Final   Bacteroides fragilis NOT DETECTED NOT DETECTED Final   Enterobacterales NOT DETECTED NOT DETECTED Final   Enterobacter cloacae complex NOT DETECTED NOT DETECTED Final   Escherichia coli NOT DETECTED NOT DETECTED Final   Klebsiella aerogenes NOT DETECTED NOT DETECTED Final   Klebsiella oxytoca NOT DETECTED NOT DETECTED Final   Klebsiella pneumoniae NOT DETECTED NOT DETECTED Final   Proteus species NOT DETECTED NOT DETECTED Final   Salmonella species NOT DETECTED NOT DETECTED Final   Serratia marcescens NOT DETECTED NOT DETECTED Final   Haemophilus influenzae NOT  DETECTED NOT DETECTED Final   Neisseria meningitidis NOT DETECTED NOT DETECTED Final   Pseudomonas aeruginosa NOT DETECTED NOT DETECTED Final   Stenotrophomonas maltophilia NOT DETECTED NOT DETECTED Final   Candida albicans NOT DETECTED NOT DETECTED Final   Candida auris NOT DETECTED NOT DETECTED Final   Candida glabrata NOT DETECTED NOT DETECTED Final   Candida krusei NOT DETECTED NOT DETECTED Final   Candida parapsilosis NOT DETECTED NOT DETECTED Final   Candida tropicalis NOT DETECTED NOT DETECTED Final   Cryptococcus neoformans/gattii NOT DETECTED NOT DETECTED Final    Comment: Performed at [facility], [address], [place], [place] [address]  SARS Coronavirus 2 by RT PCR (hospital order, performed in [facility] hospital lab) Nasopharyngeal Nasopharyngeal Swab     Status: None   Collection Time: 03/24/20  2:46 PM   Specimen: Nasopharyngeal Swab  Result Value Ref Range Status   SARS Coronavirus 2 NEGATIVE NEGATIVE Final    Comment: (NOTE) SARS-CoV-2 target nucleic acids are NOT DETECTED.  The SARS-CoV-2 RNA is generally detectable in upper and lower respiratory specimens during the acute phase of infection. The lowest concentration of SARS-CoV-2 viral copies this assay can detect is 250 copies / mL. A negative result does not preclude SARS-CoV-2 infection and should not be used as the sole basis for treatment or other patient management decisions.  A negative result may occur with improper specimen collection / handling, submission of specimen other than nasopharyngeal swab, presence of viral mutation(s) within the areas targeted by this assay, and inadequate number of viral copies (<250 copies / mL). A negative result must be combined with clinical observations, patient history, and epidemiological information.  Fact Sheet for Patients:   StrictlyIdeas.no  Fact Sheet for Healthcare Providers: BankingDealers.co.za  This  test is not yet approved or  cleared by the [place] FDA and has been authorized for detection and/or diagnosis of SARS-CoV-2 by FDA under an Emergency Use Authorization (EUA).  This EUA will remain in effect (meaning this test can be used) for the duration of the COVID-19 declaration under Section 564(b)(1) of the Act, 21 U.S.C. section 360bbb-3(b)(1), unless the authorization is terminated or revoked sooner.  Performed at [facility], [place] [address], [place], [place] [address]   Urine culture     Status: Abnormal   Collection Time: 03/24/20  3:29 PM   Specimen: Urine, Random  Result Value Ref Range Status   Specimen Description URINE, RANDOM  Final   Special Requests NONE  Final   Culture (A)  Final    50,000 COLONIES/mL DIPHTHEROIDS(CORYNEBACTERIUM SPECIES) Standardized susceptibility testing for this organism is not available. Performed at [facility], [place] [address], [place], [place] [address]    Report Status 03/26/2020 FINAL  Final  Culture, blood (Routine x 2)     Status: None (Preliminary  result)   Collection Time: 03/24/20  4:34 PM   Specimen: BLOOD RIGHT HAND  Result Value Ref Range Status   Specimen Description BLOOD RIGHT HAND  Final   Special Requests   Final    BOTTLES DRAWN AEROBIC AND ANAEROBIC Blood Culture results may not be optimal due to an inadequate volume of blood received in culture bottles   Culture   Final    NO GROWTH 4 DAYS Performed at [facility], [place] [address], [place], [place] [address]    Report Status PENDING  Incomplete  Urine culture     Status: None   Collection Time: 03/25/20  1:46 PM   Specimen: Urine, Random  Result Value Ref Range Status   Specimen Description   Final    URINE, RANDOM Performed at [place] [address] [place], [place] [address]    Special Requests   Final    NONE Performed at [facility], [place] [address] [place], [place] [address]     Culture   Final    NO GROWTH Performed at [facility], [place] [address] [place], [place] [address]    Report Status 03/26/2020 FINAL  Final  Blood culture (routine x 2)     Status: None (Preliminary result)   Collection Time: 03/25/20  1:46 PM   Specimen: BLOOD  Result Value Ref Range Status   Specimen Description   Final    BLOOD BLOOD RIGHT HAND Performed at [place] [address] [place], [place] [address]    Special Requests   Final    BOTTLES DRAWN AEROBIC ONLY Blood Culture adequate volume Performed at [place] [address], [place], [place] [address]    Culture   Final    NO GROWTH 3 DAYS Performed at [facility], [place] [address], [place], [place] [address]    Report Status PENDING  Incomplete  Blood culture (routine x 2)     Status: None (Preliminary result)   Collection Time: 03/25/20  1:51 PM   Specimen: BLOOD RIGHT ARM  Result Value Ref Range Status   Specimen Description   Final    BLOOD RIGHT ARM Performed at [facility], [place] [address], [place], [place] [address]    Special Requests   Final    BOTTLES DRAWN AEROBIC AND ANAEROBIC Blood Culture adequate volume Performed at [place] [address], [place], [place] [address]    Culture   Final    NO GROWTH 3 DAYS Performed at [facility], [place] [address], [place], [place] [address]    Report Status PENDING  Incomplete  SARS Coronavirus 2 by RT PCR (hospital order, performed in [facility] hospital lab) Nasopharyngeal Nasopharyngeal Swab     Status: None   Collection Time: 03/25/20  4:19 PM   Specimen: Nasopharyngeal Swab  Result Value Ref Range Status   SARS Coronavirus 2 NEGATIVE NEGATIVE Final    Comment: (NOTE) SARS-CoV-2 target nucleic acids are NOT DETECTED.  The SARS-CoV-2 RNA is generally detectable in upper and lower respiratory specimens during the acute phase of infection. The lowest concentration  of SARS-CoV-2 viral copies this assay can detect is 250 copies / mL. A negative result does not preclude SARS-CoV-2 infection and should not be used as the sole basis for treatment or other patient management decisions.  A negative result may occur with improper specimen collection / handling, submission  of specimen other than nasopharyngeal swab, presence of viral mutation(s) within the areas targeted by this assay, and inadequate number of viral copies (<250 copies / mL). A negative result must be combined with clinical observations, patient history, and epidemiological information.  Fact Sheet for Patients:   StrictlyIdeas.no  Fact Sheet for Healthcare Providers: BankingDealers.co.za  This test is not yet approved or  cleared by the [place] FDA and has been authorized for detection and/or diagnosis of SARS-CoV-2 by FDA under an Emergency Use Authorization (EUA).  This EUA will remain in effect (meaning this test can be used) for the duration of the COVID-19 declaration under Section 564(b)(1) of the Act, 21 U.S.C. section 360bbb-3(b)(1), unless the authorization is terminated or revoked sooner.  Performed at [facility], [place] [address], [place], [place] [address]     Impression/Plan:  1. GBS bacteremia - no clear source but improved.  REpeat cultures remain ngtd.  Has been on antibiotics 5 days.  At this point, can continue with amoxicillin oral 500 mg three times a day at discharge to complete 10 days total.  2. Shoulder pain - some improvement and MRI c/w muscle strain.   Ok from [facility] standpoint for discharge.  I will sign off

## 2020-03-28 NOTE — Care Management Important Message (Signed)
Important Message  Patient Details IM Letter given to the Patient Name: Meilech Virts MRN: [id number] Date of Birth: [birth year]   Medicare Important Message Given:  Yes     Kerin Salen 03/28/2020, 9:58 AM

## 2020-03-28 NOTE — Discharge Summary (Signed)
Physician Discharge Summary  Severiano Utsey VXB:[id number] DOB: 1931-01-20 DOA: 03/25/2020  PCP: [name], MD  Admit date: 03/25/2020 Discharge date: 03/28/2020  Admitted From: Home Disposition:  Home  Discharge Condition:Stable CODE STATUS: DNR Diet recommendation: Heart Healthy  Brief/Interim Summary: Patient is 86 year old male with history of Alzheimer's dementia, prostate cancer, hypertension, abdominal aortic aneurysm, urinary incontinence who was admitted here because of positive blood cultures.  Patient actually presented to the [facility] emergency department with fever of 103 F, chills and rigors, lactic acidosis, leukocytosis on 03/24/2020.  He was sent home from the emergency department after initial stabilization.  Blood cultures sent that day came out to be positive for Streptococcus agalactiae and he had to be called back for admission.  Even after discharge, he had fever, chills and worsening confusion at home.  Patient was complaining of right shoulder pain and back pain on presentation.  Right shoulder x-ray did not show any significant finding.  He has been started on penicillin..  Blood cultures have been negative and currently he is afebrile.  ID consulted today.  We obtained MRI of the back and right shoulder , there was no finding of discitis/osteomyelitis.  MRI of the right shoulder showed moderate osteoarthritis, muscle strain.    He has been cleared by ID for discharge with oral antibiotics.  Following problems were addressed during his hospitalization:  Streptococcal agalactiae bacteremia: No clear source of infection.  Currently afebrile.  Initially presented with fever, chills, leukocytosis, lactic acidosis.  Blood culture sent on 8/21 showed streptococcal agalactiae. Patient was started on penicillin here.  Currently afebrile.  Repeat blood cultures have been negative so far. ID consulted .Continue amoxicillin 3 times a day to complete 10 days course.  Right  shoulder pain/back pain: Low suspicious for infection on these sites but we did MRI of the right shoulder and back.  Patient lives with his family and has difficulty with ambulation.  MRI finding as above.  He needs to follow-up with orthopedics as an outpatient.  Physical therapy recommended home health.  Dementia: Confused at times but currently alert oriented.  Continue home medications.  Coronary artery disease: Currently stable.  Continue home medications  Urinary incontinence: Chronic.  Continue Subedi care  Hypertension: Continue home medications.  He is mildly hypertensive. We do not want to aggressively lower his blood pressure because of his advanced age and  high risks of falls  Discharge Diagnoses:  Principal Problem:   Streptococcal bacteremia Active Problems:   Coronary artery disease involving native coronary artery of native heart with angina pectoris ([place])   Essential hypertension   Dementia (HCC)   Urinary incontinence    Discharge Instructions  Discharge Instructions    Diet - low sodium heart healthy   Complete by: As directed    Discharge instructions   Complete by: As directed    1)Please take prescribed medications as instructed. 2)Follow up with orthopedics as an outpatient for the evaluation of your shoulder pain.  Name and number the provider has been attached.  Please call for appointment.   Increase activity slowly   Complete by: As directed      Allergies as of 03/28/2020   No Known Allergies     Medication List    TAKE these medications   acetaminophen 325 MG tablet Commonly known as: TYLENOL Take 2 tablets (650 mg total) by mouth every 6 (six) hours as needed for mild pain (or Fever >/= 101).   amoxicillin 500 MG capsule Commonly  known as: AMOXIL Take 1 capsule (500 mg total) by mouth every 8 (eight) hours for 6 days.   aspirin EC 81 MG tablet Take 81 mg by mouth as directed. Take on MWF   atorvastatin 10 MG tablet Commonly known  as: LIPITOR TAKE 1 TABLET BY MOUTH  DAILY   Co-Enzyme Q-10 30 MG Caps Take 10 mg by mouth daily.   diclofenac Sodium 1 % Gel Commonly known as: VOLTAREN Apply 2 g topically 4 (four) times daily as needed (pain).   ibuprofen 800 MG tablet Commonly known as: ADVIL Take 800 mg by mouth every 8 (eight) hours as needed for moderate pain.   magnesium oxide 400 MG tablet Commonly known as: MAG-OX Take 400 mg by mouth daily.   metoprolol succinate 50 MG 24 hr tablet Commonly known as: TOPROL-XL Take 0.5 tablets (25 mg total) by mouth daily. Take with or immediately following a meal.   Multi-Vitamins Tabs Take 1 tablet by mouth daily.   Namzaric 28-10 MG Cp24 Generic drug: Memantine HCl-Donepezil HCl Take 1 capsule by mouth daily.   ramipril 2.5 MG capsule Commonly known as: ALTACE TAKE 1 CAPSULE BY MOUTH  DAILY   traZODone 50 MG tablet Commonly known as: DESYREL Take 50 mg by mouth at bedtime.   vitamin B-12 1000 MCG tablet Commonly known as: CYANOCOBALAMIN Take 1 tablet by mouth daily.   VITAMIN D PO Take 2,000 Units by mouth daily.   Zinc 50 MG Caps Take 50 mg by mouth daily.       Follow-up Information    Nicholes Stairs, MD. Schedule an appointment as soon as possible for a visit in 1 week(s).   Specialty: Orthopedic Surgery Contact information: [address] [place] [address] [phone]              No Known Allergies  Consultations:  ID   Procedures/Studies: DG Chest 2 View  Result Date: 03/24/2020 CLINICAL DATA:  Fever. EXAM: CHEST - 2 VIEW COMPARISON:  September 03, 2015 FINDINGS: The heart size and mediastinal contours are within normal limits. Both lungs are clear. The visualized skeletal structures are unremarkable. IMPRESSION: No active cardiopulmonary disease. Electronically Signed   By: Dorise Bullion III M.D   On: 03/24/2020 10:29   DG Shoulder Right  Result Date: 03/25/2020 CLINICAL DATA:  Fever and pain  EXAM: RIGHT SHOULDER - 2+ VIEW COMPARISON:  None. FINDINGS: There is no evidence of fracture or dislocation. There is no evidence of arthropathy or other focal bone abnormality. Soft tissues are unremarkable. IMPRESSION: Negative. Electronically Signed   By: Dorise Bullion III M.D   On: 03/25/2020 15:50   MR THORACIC SPINE W WO CONTRAST  Result Date: 03/27/2020 CLINICAL DATA:  Bacteremia, back pain EXAM: MRI THORACIC AND LUMBAR SPINE WITHOUT AND WITH CONTRAST TECHNIQUE: Multiplanar and multiecho pulse sequences of the thoracic and lumbar spine were obtained without and with intravenous contrast. CONTRAST:  32mL GADAVIST GADOBUTROL 1 MMOL/ML IV SOLN COMPARISON:  None. FINDINGS: MRI THORACIC SPINE Motion artifact is present particularly on axial imaging. Alignment: Anteroposterior alignment is maintained. Vertebrae: No acute compression deformity. There is no marrow edema. No suspicious osseous lesion. Cord: No abnormal signal within the above limitation. No abnormal intrathecal enhancement. No epidural collection. Paraspinal and other soft tissues: Unremarkable. Disc levels: Partial fusion across the T10-T11 disc space. No significant disc herniation. No significant canal or foraminal stenosis at any level. MRI LUMBAR SPINE Segmentation:  Standard. Alignment:  Preserved. Vertebrae: No acute  compression deformity. No significant marrow edema. No suspicious osseous lesion. Conus medullaris: Extends to the L1-L2 level and appears normal. No abnormal intrathecal enhancement. No epidural collection. Paraspinal and other soft tissues: Unremarkable. Disc levels: Punctate central annular fissure at L4-L5. Minor disc bulge with punctate central annular fissure at L5-S1. Facet arthropathy at L5-S1. No significant canal or foraminal stenosis at any level. IMPRESSION: No evidence of discitis or osteomyelitis. Minor degenerative changes. Electronically Signed   By: Macy Mis M.D.   On: 03/27/2020 18:10   MR Lumbar  Spine W Wo Contrast  Result Date: 03/27/2020 CLINICAL DATA:  Bacteremia, back pain EXAM: MRI THORACIC AND LUMBAR SPINE WITHOUT AND WITH CONTRAST TECHNIQUE: Multiplanar and multiecho pulse sequences of the thoracic and lumbar spine were obtained without and with intravenous contrast. CONTRAST:  78mL GADAVIST GADOBUTROL 1 MMOL/ML IV SOLN COMPARISON:  None. FINDINGS: MRI THORACIC SPINE Motion artifact is present particularly on axial imaging. Alignment: Anteroposterior alignment is maintained. Vertebrae: No acute compression deformity. There is no marrow edema. No suspicious osseous lesion. Cord: No abnormal signal within the above limitation. No abnormal intrathecal enhancement. No epidural collection. Paraspinal and other soft tissues: Unremarkable. Disc levels: Partial fusion across the T10-T11 disc space. No significant disc herniation. No significant canal or foraminal stenosis at any level. MRI LUMBAR SPINE Segmentation:  Standard. Alignment:  Preserved. Vertebrae: No acute compression deformity. No significant marrow edema. No suspicious osseous lesion. Conus medullaris: Extends to the L1-L2 level and appears normal. No abnormal intrathecal enhancement. No epidural collection. Paraspinal and other soft tissues: Unremarkable. Disc levels: Punctate central annular fissure at L4-L5. Minor disc bulge with punctate central annular fissure at L5-S1. Facet arthropathy at L5-S1. No significant canal or foraminal stenosis at any level. IMPRESSION: No evidence of discitis or osteomyelitis. Minor degenerative changes. Electronically Signed   By: Macy Mis M.D.   On: 03/27/2020 18:10   MR SHOULDER RIGHT W WO CONTRAST  Result Date: 03/28/2020 CLINICAL DATA:  Right shoulder pain, bacteremia EXAM: MRI OF THE RIGHT SHOULDER WITHOUT AND WITH CONTRAST TECHNIQUE: Multiplanar, multisequence MR imaging of the right shoulder was performed before and after the administration of intravenous contrast. CONTRAST:  20mL GADAVIST  GADOBUTROL 1 MMOL/ML IV SOLN COMPARISON:  X-ray 03/25/2020 FINDINGS: Rotator cuff: Intact supraspinatus, infraspinatus, teres minor and subscapularis tendons. Muscles: No atrophy or abnormal signal of the muscles of the rotator cuff. Minimal intramuscular edema within the lateral deltoid proximally. No intramuscular fluid collection. Biceps long head:  Intact. Acromioclavicular Joint: Moderate arthropathy of the acromioclavicular joint with capsular edema. No subacromial/subdeltoid bursal fluid. Glenohumeral Joint: No significant glenohumeral joint effusion. There is a trace amount of fluid within the subscapularis joint recess. No evidence of synovitis. No intra-articular loose body. Mild chondral thinning without focal defect. Labrum: Posterosuperior labrum appears degenerated. No paralabral cyst. Bones: No acute fracture. No dislocation. Mild reactive marrow signal changes at the acromioclavicular joint. Bone marrow signal intensity is otherwise within normal limits. No cortical destruction or marrow replacement. Other: No soft tissue fluid collection. IMPRESSION: 1. No significant glenohumeral joint effusion or synovitis to suggest septic arthritis. No evidence of osteomyelitis. 2. Moderate acromioclavicular osteoarthritis. 3. Minimal intramuscular edema within the lateral deltoid proximally. Findings may reflect a low-grade muscle strain. 4. Intact rotator cuff. Electronically Signed   By: Davina Poke D.O.   On: 03/28/2020 08:13   ECHOCARDIOGRAM LIMITED  Result Date: 03/26/2020    ECHOCARDIOGRAM LIMITED REPORT   Patient Name:   LOI RENNAKER Date of Exam: 03/26/2020 Medical Rec #:  [id number]    Height:       67.0 in Accession #:    [id number]   Weight:       150.0 lb Date of Birth:  [birth year]    BSA:          1.790 m Patient Age:    52 years     BP:           127/68 mmHg Patient Gender: M            HR:           59 bpm. Exam Location:  Inpatient Procedure: Limited Echo, Color Doppler and Cardiac  Doppler Indications:    Bacteremia  History:        Patient has no prior history of Echocardiogram examinations.                 Signs/Symptoms:Alzheimer's; Risk Factors:Hypertension and                 Dyslipidemia.  Sonographer:    Raquel Sarna Senior RDCS Referring Phys: Gore  Sonographer Comments: Technically difficult due to poor echo windows. IMPRESSIONS  1. Cardiac valves are suboptimally visulized to exclude endocarditis.  2. Left ventricular ejection fraction, by estimation, is 60 to 65%. The left ventricle has normal function. The left ventricle has no regional wall motion abnormalities.  3. Right ventricular systolic function is normal. The right ventricular size is normal. There is mildly elevated pulmonary artery systolic pressure. The estimated right ventricular systolic pressure is 02.6 mmHg.  4. The mitral valve is abnormal. Trivial mitral valve regurgitation.  5. Tricuspid valve regurgitation is mild to moderate.  6. The inferior vena cava is normal in size with <50% respiratory variability, suggesting right atrial pressure of 8 mmHg. Conclusion(s)/Recommendation(s): No evidence of valvular vegetations on this transthoracic echocardiogram. Would recommend a transesophageal echocardiogram to exclude infective endocarditis if clinically indicated. FINDINGS  Left Ventricle: Left ventricular ejection fraction, by estimation, is 60 to 65%. The left ventricle has normal function. The left ventricle has no regional wall motion abnormalities. Right Ventricle: The right ventricular size is normal. No increase in right ventricular wall thickness. Right ventricular systolic function is normal. There is mildly elevated pulmonary artery systolic pressure. The tricuspid regurgitant velocity is 2.80  m/s, and with an assumed right atrial pressure of 8 mmHg, the estimated right ventricular systolic pressure is 37.8 mmHg. Mitral Valve: The mitral valve is abnormal. There is mild thickening of the mitral valve  leaflet(s). Trivial mitral valve regurgitation. Tricuspid Valve: The tricuspid valve is not well visualized. Tricuspid valve regurgitation is mild to moderate. Venous: The inferior vena cava is normal in size with less than 50% respiratory variability, suggesting right atrial pressure of 8 mmHg. TRICUSPID VALVE TR Peak grad:   31.4 mmHg TR Vmax:        280.00 cm/s Lyman Bishop MD Electronically signed by Lyman Bishop MD Signature Date/Time: 03/26/2020/1:30:27 PM    Final        Subjective:  Patient seen and examined the bedside this morning.  Hemodynamically  stable for discharge today.  Discharge Exam: Vitals:   03/28/20 0433 03/28/20 1352  BP: (!) 166/98 (!) 173/82  Pulse: 65 65  Resp: 18 16  Temp:  97.7 F (36.5 C)  SpO2: 93% 95%   Vitals:   03/27/20 1520 03/27/20 2026 03/28/20 0433 03/28/20 1352  BP: 112/83 (!) 167/82 (!) 166/98 (!) 173/82  Pulse: 64 69 65 65  Resp: 19 16  18 16  Temp: 98.6 F (37 C) 98.2 F (36.8 C)  97.7 F (36.5 C)  TempSrc: Oral Oral  Oral  SpO2: 96% 98% 93% 95%  Weight:      Height:        General: Pt is alert, awake, not in acute distress Cardiovascular: RRR, S1/S2 +, no rubs, no gallops Respiratory: CTA bilaterally, no wheezing, no rhonchi Abdominal: Soft, NT, ND, bowel sounds + Extremities: no edema, no cyanosis    The results of significant diagnostics from this hospitalization (including imaging, microbiology, ancillary and laboratory) are listed below for reference.     Microbiology: Recent Results (from the past 240 hour(s))  Culture, blood (Routine x 2)     Status: Abnormal   Collection Time: 03/24/20  2:41 PM   Specimen: BLOOD  Result Value Ref Range Status   Specimen Description BLOOD LEFT ANTECUBITAL  Final   Special Requests   Final    BOTTLES DRAWN AEROBIC AND ANAEROBIC Blood Culture adequate volume   Culture  Setup Time   Final    GRAM POSITIVE COCCI IN CHAINS IN BOTH AEROBIC AND ANAEROBIC BOTTLES Organism ID to  follow CRITICAL RESULT CALLED TO, READ BACK BY AND VERIFIED WITH: rn at [facility] [id number] at 825 am by cm to s maynard Performed at [facility], [place] [address] [place], [place] [address]    Culture GROUP B STREP(S.AGALACTIAE)ISOLATED (A)  Final   Report Status 03/27/2020 FINAL  Final   Organism ID, Bacteria GROUP B STREP(S.AGALACTIAE)ISOLATED  Final      Susceptibility   Group b strep(s.agalactiae)isolated - MIC*    CLINDAMYCIN >=1 RESISTANT Resistant     AMPICILLIN <=0.25 SENSITIVE Sensitive     ERYTHROMYCIN >=8 RESISTANT Resistant     VANCOMYCIN 0.5 SENSITIVE Sensitive     CEFTRIAXONE <=0.12 SENSITIVE Sensitive     LEVOFLOXACIN 1 SENSITIVE Sensitive     * GROUP B STREP(S.AGALACTIAE)ISOLATED  Blood Culture ID Panel (Reflexed)     Status: Abnormal   Collection Time: 03/24/20  2:41 PM  Result Value Ref Range Status   Enterococcus faecalis NOT DETECTED NOT DETECTED Final   Enterococcus Faecium NOT DETECTED NOT DETECTED Final   Listeria monocytogenes NOT DETECTED NOT DETECTED Final   Staphylococcus species NOT DETECTED NOT DETECTED Final   Staphylococcus aureus (BCID) NOT DETECTED NOT DETECTED Final   Staphylococcus epidermidis NOT DETECTED NOT DETECTED Final   Staphylococcus lugdunensis NOT DETECTED NOT DETECTED Final   Streptococcus species DETECTED (A) NOT DETECTED Final    Comment: CRITICAL RESULT CALLED TO, READ BACK BY AND VERIFIED WITH: rn nchp s maynard [id number] at 825 am by cm    Streptococcus agalactiae DETECTED (A) NOT DETECTED Final    Comment: CRITICAL RESULT CALLED TO, READ BACK BY AND VERIFIED WITH: rn mchp s maynard [id number] at 827 am by cm    Streptococcus pneumoniae NOT DETECTED NOT DETECTED Final   Streptococcus pyogenes NOT DETECTED NOT DETECTED Final   A.calcoaceticus-baumannii NOT DETECTED NOT DETECTED Final   Bacteroides fragilis NOT DETECTED NOT DETECTED Final   Enterobacterales NOT DETECTED NOT DETECTED Final   Enterobacter cloacae complex NOT DETECTED NOT  DETECTED Final   Escherichia coli NOT DETECTED NOT DETECTED Final   Klebsiella aerogenes NOT DETECTED NOT DETECTED Final   Klebsiella oxytoca NOT DETECTED NOT DETECTED Final   Klebsiella pneumoniae NOT DETECTED NOT DETECTED Final   Proteus species NOT DETECTED NOT DETECTED Final   Salmonella species NOT DETECTED NOT DETECTED Final   Serratia  marcescens NOT DETECTED NOT DETECTED Final   Haemophilus influenzae NOT DETECTED NOT DETECTED Final   Neisseria meningitidis NOT DETECTED NOT DETECTED Final   Pseudomonas aeruginosa NOT DETECTED NOT DETECTED Final   Stenotrophomonas maltophilia NOT DETECTED NOT DETECTED Final   Candida albicans NOT DETECTED NOT DETECTED Final   Candida auris NOT DETECTED NOT DETECTED Final   Candida glabrata NOT DETECTED NOT DETECTED Final   Candida krusei NOT DETECTED NOT DETECTED Final   Candida parapsilosis NOT DETECTED NOT DETECTED Final   Candida tropicalis NOT DETECTED NOT DETECTED Final   Cryptococcus neoformans/gattii NOT DETECTED NOT DETECTED Final    Comment: Performed at [facility], [place] [address], [place], [place] [address]  SARS Coronavirus 2 by RT PCR (hospital order, performed in [facility] hospital lab) Nasopharyngeal Nasopharyngeal Swab     Status: None   Collection Time: 03/24/20  2:46 PM   Specimen: Nasopharyngeal Swab  Result Value Ref Range Status   SARS Coronavirus 2 NEGATIVE NEGATIVE Final    Comment: (NOTE) SARS-CoV-2 target nucleic acids are NOT DETECTED.  The SARS-CoV-2 RNA is generally detectable in upper and lower respiratory specimens during the acute phase of infection. The lowest concentration of SARS-CoV-2 viral copies this assay can detect is 250 copies / mL. A negative result does not preclude SARS-CoV-2 infection and should not be used as the sole basis for treatment or other patient management decisions.  A negative result may occur with improper specimen collection / handling, submission of specimen other than  nasopharyngeal swab, presence of viral mutation(s) within the areas targeted by this assay, and inadequate number of viral copies (<250 copies / mL). A negative result must be combined with clinical observations, patient history, and epidemiological information.  Fact Sheet for Patients:   StrictlyIdeas.no  Fact Sheet for Healthcare Providers: BankingDealers.co.za  This test is not yet approved or  cleared by the [place] FDA and has been authorized for detection and/or diagnosis of SARS-CoV-2 by FDA under an Emergency Use Authorization (EUA).  This EUA will remain in effect (meaning this test can be used) for the duration of the COVID-19 declaration under Section 564(b)(1) of the Act, 21 U.S.C. section 360bbb-3(b)(1), unless the authorization is terminated or revoked sooner.  Performed at [facility], [place] [address], [place], [place] [address]   Urine culture     Status: Abnormal   Collection Time: 03/24/20  3:29 PM   Specimen: Urine, Random  Result Value Ref Range Status   Specimen Description URINE, RANDOM  Final   Special Requests NONE  Final   Culture (A)  Final    50,000 COLONIES/mL DIPHTHEROIDS(CORYNEBACTERIUM SPECIES) Standardized susceptibility testing for this organism is not available. Performed at [facility], [place] [address], [place], [place] [address]    Report Status 03/26/2020 FINAL  Final  Culture, blood (Routine x 2)     Status: None (Preliminary result)   Collection Time: 03/24/20  4:34 PM   Specimen: BLOOD RIGHT HAND  Result Value Ref Range Status   Specimen Description BLOOD RIGHT HAND  Final   Special Requests   Final    BOTTLES DRAWN AEROBIC AND ANAEROBIC Blood Culture results may not be optimal due to an inadequate volume of blood received in culture bottles   Culture   Final    NO GROWTH 4 DAYS Performed at [facility], [place] [address], [place], [place] [address]    Report  Status PENDING  Incomplete  Urine  culture     Status: None   Collection Time: 03/25/20  1:46 PM   Specimen: Urine, Random  Result Value Ref Range Status   Specimen Description   Final    URINE, RANDOM Performed at [place] [address] [place], [place] [address]    Special Requests   Final    NONE Performed at [facility], [place] [address] [place], [place] [address]    Culture   Final    NO GROWTH Performed at [facility], [place] [address] [place], [place] [address]    Report Status 03/26/2020 FINAL  Final  Blood culture (routine x 2)     Status: None (Preliminary result)   Collection Time: 03/25/20  1:46 PM   Specimen: BLOOD  Result Value Ref Range Status   Specimen Description   Final    BLOOD BLOOD RIGHT HAND Performed at [place] [address], [place], [place] [address]    Special Requests   Final    BOTTLES DRAWN AEROBIC ONLY Blood Culture adequate volume Performed at [place] [address], [place], [place] [address]    Culture   Final    NO GROWTH 3 DAYS Performed at [facility], [place] [address], [place], [place] [address]    Report Status PENDING  Incomplete  Blood culture (routine x 2)     Status: None (Preliminary result)   Collection Time: 03/25/20  1:51 PM   Specimen: BLOOD RIGHT ARM  Result Value Ref Range Status   Specimen Description   Final    BLOOD RIGHT ARM Performed at [facility], [place] [address] [place], [place] [address]    Special Requests   Final    BOTTLES DRAWN AEROBIC AND ANAEROBIC Blood Culture adequate volume Performed at [place] [address], [place], [place] [address]    Culture   Final    NO GROWTH 3 DAYS Performed at [facility], [place] [address], [place], [place] [address]    Report Status PENDING  Incomplete  SARS Coronavirus 2 by RT PCR (hospital order, performed in [facility]  hospital lab) Nasopharyngeal Nasopharyngeal Swab     Status: None   Collection Time: 03/25/20  4:19 PM   Specimen: Nasopharyngeal Swab  Result Value Ref Range Status   SARS Coronavirus 2 NEGATIVE NEGATIVE Final    Comment: (NOTE) SARS-CoV-2 target nucleic acids are NOT DETECTED.  The SARS-CoV-2 RNA is generally detectable in upper and lower respiratory specimens during the acute phase of infection. The lowest concentration of SARS-CoV-2 viral copies this assay can detect is 250 copies / mL. A negative result does not preclude SARS-CoV-2 infection and should not be used as the sole basis for treatment or other patient management decisions.  A negative result may occur with improper specimen collection / handling, submission of specimen other than nasopharyngeal swab, presence of viral mutation(s) within the areas targeted by this assay, and inadequate number of viral copies (<250 copies / mL). A negative result must be combined with clinical observations, patient history, and epidemiological information.  Fact Sheet for Patients:   StrictlyIdeas.no  Fact Sheet for Healthcare Providers: BankingDealers.co.za  This test is not yet approved or  cleared by the [place] FDA and has been authorized for detection and/or diagnosis of SARS-CoV-2 by FDA under an Emergency Use Authorization (EUA).  This EUA will remain in effect (meaning this test can  be used) for the duration of the COVID-19 declaration under Section 564(b)(1) of the Act, 21 U.S.C. section 360bbb-3(b)(1), unless the authorization is terminated or revoked sooner.  Performed at [facility], [place] [address] [place], [place] [address]      Labs: BNP (last 3 results) No results for input(s): BNP in the last 8760 hours. Basic Metabolic Panel: Recent Labs  Lab 03/24/20 0928 03/25/20 1346 03/26/20 0915 03/27/20 0758 03/28/20 0542  NA 144 141 140 135  139  K 3.9 4.2 3.6 4.0 3.7  CL 110 108 108 102 103  CO2 21* 24 24 25 27   GLUCOSE 146* 132* 110* 126* 117*  BUN 20 20 15 11 12   CREATININE 1.19 1.11 1.03 0.96 1.08  CALCIUM 9.1 8.9 8.4* 8.5* 9.1  MG  --   --   --  2.0 2.0   Liver Function Tests: Recent Labs  Lab 03/24/20 0928 03/25/20 1346 03/26/20 0915  AST 23 31 27   ALT 19 19 17   ALKPHOS 63 54 48  BILITOT 1.3* 1.0 0.8  PROT 6.4* 6.6 5.8*  ALBUMIN 4.1 4.0 3.4*   No results for input(s): LIPASE, AMYLASE in the last 168 hours. No results for input(s): AMMONIA in the last 168 hours. CBC: Recent Labs  Lab 03/24/20 0928 03/25/20 1346 03/26/20 0915 03/27/20 0758 03/28/20 0542  WBC 13.9* 17.6* 10.8* 8.3 8.6  NEUTROABS 13.0*  --   --  6.7 6.2  HGB 15.5 14.9 14.3 15.2 16.3  HCT 46.3 45.2 41.6 43.1 47.4  MCV 91.9 94.8 92.7 90.9 90.8  PLT 165 132* 136* 150 172   Cardiac Enzymes: No results for input(s): CKTOTAL, CKMB, CKMBINDEX, TROPONINI in the last 168 hours. BNP: Invalid input(s): POCBNP CBG: No results for input(s): GLUCAP in the last 168 hours. D-Dimer No results for input(s): DDIMER in the last 72 hours. Hgb A1c No results for input(s): HGBA1C in the last 72 hours. Lipid Profile No results for input(s): CHOL, HDL, LDLCALC, TRIG, CHOLHDL, LDLDIRECT in the last 72 hours. Thyroid function studies No results for input(s): TSH, T4TOTAL, T3FREE, THYROIDAB in the last 72 hours.  Invalid input(s): FREET3 Anemia work up No results for input(s): VITAMINB12, FOLATE, FERRITIN, TIBC, IRON, RETICCTPCT in the last 72 hours. Urinalysis    Component Value Date/Time   COLORURINE YELLOW 03/25/2020 1346   APPEARANCEUR CLEAR 03/25/2020 1346   LABSPEC 1.024 03/25/2020 1346   PHURINE 5.0 03/25/2020 1346   GLUCOSEU NEGATIVE 03/25/2020 1346   HGBUR NEGATIVE 03/25/2020 1346   BILIRUBINUR NEGATIVE 03/25/2020 1346   KETONESUR 20 (A) 03/25/2020 1346   PROTEINUR 30 (A) 03/25/2020 1346   NITRITE NEGATIVE 03/25/2020 1346    LEUKOCYTESUR NEGATIVE 03/25/2020 1346   Sepsis Labs Invalid input(s): PROCALCITONIN,  WBC,  LACTICIDVEN Microbiology Recent Results (from the past 240 hour(s))  Culture, blood (Routine x 2)     Status: Abnormal   Collection Time: 03/24/20  2:41 PM   Specimen: BLOOD  Result Value Ref Range Status   Specimen Description BLOOD LEFT ANTECUBITAL  Final   Special Requests   Final    BOTTLES DRAWN AEROBIC AND ANAEROBIC Blood Culture adequate volume   Culture  Setup Time   Final    GRAM POSITIVE COCCI IN CHAINS IN BOTH AEROBIC AND ANAEROBIC BOTTLES Organism ID to follow CRITICAL RESULT CALLED TO, READ BACK BY AND VERIFIED WITH: rn at [facility] [id number] at 825 am by cm to s maynard Performed at [facility], [place],  [place] [address]    Culture GROUP B STREP(S.AGALACTIAE)ISOLATED (A)  Final   Report Status 03/27/2020 FINAL  Final   Organism ID, Bacteria GROUP B STREP(S.AGALACTIAE)ISOLATED  Final      Susceptibility   Group b strep(s.agalactiae)isolated - MIC*    CLINDAMYCIN >=1 RESISTANT Resistant     AMPICILLIN <=0.25 SENSITIVE Sensitive     ERYTHROMYCIN >=8 RESISTANT Resistant     VANCOMYCIN 0.5 SENSITIVE Sensitive     CEFTRIAXONE <=0.12 SENSITIVE Sensitive     LEVOFLOXACIN 1 SENSITIVE Sensitive     * GROUP B STREP(S.AGALACTIAE)ISOLATED  Blood Culture ID Panel (Reflexed)     Status: Abnormal   Collection Time: 03/24/20  2:41 PM  Result Value Ref Range Status   Enterococcus faecalis NOT DETECTED NOT DETECTED Final   Enterococcus Faecium NOT DETECTED NOT DETECTED Final   Listeria monocytogenes NOT DETECTED NOT DETECTED Final   Staphylococcus species NOT DETECTED NOT DETECTED Final   Staphylococcus aureus (BCID) NOT DETECTED NOT DETECTED Final   Staphylococcus epidermidis NOT DETECTED NOT DETECTED Final   Staphylococcus lugdunensis NOT DETECTED NOT DETECTED Final   Streptococcus species DETECTED (A) NOT DETECTED Final    Comment: CRITICAL RESULT CALLED TO, READ BACK  BY AND VERIFIED WITH: rn nchp s maynard [id number] at 825 am by cm    Streptococcus agalactiae DETECTED (A) NOT DETECTED Final    Comment: CRITICAL RESULT CALLED TO, READ BACK BY AND VERIFIED WITH: rn mchp s maynard [id number] at 827 am by cm    Streptococcus pneumoniae NOT DETECTED NOT DETECTED Final   Streptococcus pyogenes NOT DETECTED NOT DETECTED Final   A.calcoaceticus-baumannii NOT DETECTED NOT DETECTED Final   Bacteroides fragilis NOT DETECTED NOT DETECTED Final   Enterobacterales NOT DETECTED NOT DETECTED Final   Enterobacter cloacae complex NOT DETECTED NOT DETECTED Final   Escherichia coli NOT DETECTED NOT DETECTED Final   Klebsiella aerogenes NOT DETECTED NOT DETECTED Final   Klebsiella oxytoca NOT DETECTED NOT DETECTED Final   Klebsiella pneumoniae NOT DETECTED NOT DETECTED Final   Proteus species NOT DETECTED NOT DETECTED Final   Salmonella species NOT DETECTED NOT DETECTED Final   Serratia marcescens NOT DETECTED NOT DETECTED Final   Haemophilus influenzae NOT DETECTED NOT DETECTED Final   Neisseria meningitidis NOT DETECTED NOT DETECTED Final   Pseudomonas aeruginosa NOT DETECTED NOT DETECTED Final   Stenotrophomonas maltophilia NOT DETECTED NOT DETECTED Final   Candida albicans NOT DETECTED NOT DETECTED Final   Candida auris NOT DETECTED NOT DETECTED Final   Candida glabrata NOT DETECTED NOT DETECTED Final   Candida krusei NOT DETECTED NOT DETECTED Final   Candida parapsilosis NOT DETECTED NOT DETECTED Final   Candida tropicalis NOT DETECTED NOT DETECTED Final   Cryptococcus neoformans/gattii NOT DETECTED NOT DETECTED Final    Comment: Performed at [facility], [address] [place], [place] [address]  SARS Coronavirus 2 by RT PCR (hospital order, performed in [facility] hospital lab) Nasopharyngeal Nasopharyngeal Swab     Status: None   Collection Time: 03/24/20  2:46 PM   Specimen: Nasopharyngeal Swab  Result Value Ref Range Status   SARS Coronavirus 2  NEGATIVE NEGATIVE Final    Comment: (NOTE) SARS-CoV-2 target nucleic acids are NOT DETECTED.  The SARS-CoV-2 RNA is generally detectable in upper and lower respiratory specimens during the acute phase of infection. The lowest concentration of SARS-CoV-2 viral copies this assay can detect is 250 copies / mL. A negative result does not preclude SARS-CoV-2 infection and should not be  used as the sole basis for treatment or other patient management decisions.  A negative result may occur with improper specimen collection / handling, submission of specimen other than nasopharyngeal swab, presence of viral mutation(s) within the areas targeted by this assay, and inadequate number of viral copies (<250 copies / mL). A negative result must be combined with clinical observations, patient history, and epidemiological information.  Fact Sheet for Patients:   StrictlyIdeas.no  Fact Sheet for Healthcare Providers: BankingDealers.co.za  This test is not yet approved or  cleared by the [place] FDA and has been authorized for detection and/or diagnosis of SARS-CoV-2 by FDA under an Emergency Use Authorization (EUA).  This EUA will remain in effect (meaning this test can be used) for the duration of the COVID-19 declaration under Section 564(b)(1) of the Act, 21 U.S.C. section 360bbb-3(b)(1), unless the authorization is terminated or revoked sooner.  Performed at [facility], [place] [address] [place], [place] [address]   Urine culture     Status: Abnormal   Collection Time: 03/24/20  3:29 PM   Specimen: Urine, Random  Result Value Ref Range Status   Specimen Description URINE, RANDOM  Final   Special Requests NONE  Final   Culture (A)  Final    50,000 COLONIES/mL DIPHTHEROIDS(CORYNEBACTERIUM SPECIES) Standardized susceptibility testing for this organism is not available. Performed at [facility], [place] [address]  [place], [place] [address]    Report Status 03/26/2020 FINAL  Final  Culture, blood (Routine x 2)     Status: None (Preliminary result)   Collection Time: 03/24/20  4:34 PM   Specimen: BLOOD RIGHT HAND  Result Value Ref Range Status   Specimen Description BLOOD RIGHT HAND  Final   Special Requests   Final    BOTTLES DRAWN AEROBIC AND ANAEROBIC Blood Culture results may not be optimal due to an inadequate volume of blood received in culture bottles   Culture   Final    NO GROWTH 4 DAYS Performed at [facility], [place] [address] [place], [place] [address]    Report Status PENDING  Incomplete  Urine culture     Status: None   Collection Time: 03/25/20  1:46 PM   Specimen: Urine, Random  Result Value Ref Range Status   Specimen Description   Final    URINE, RANDOM Performed at [place] [address], [place], [place] [address]    Special Requests   Final    NONE Performed at [facility], [place] [address] [place], [place] [address]    Culture   Final    NO GROWTH Performed at [facility], [place] [address], [place], [place] [address]    Report Status 03/26/2020 FINAL  Final  Blood culture (routine x 2)     Status: None (Preliminary result)   Collection Time: 03/25/20  1:46 PM   Specimen: BLOOD  Result Value Ref Range Status   Specimen Description   Final    BLOOD BLOOD RIGHT HAND Performed at [place] [address] [place], [place] [address]    Special Requests   Final    BOTTLES DRAWN AEROBIC ONLY Blood Culture adequate volume Performed at [place] [address], [place], [place] [address]    Culture   Final    NO GROWTH 3 DAYS Performed at [facility], [place] [address], [place], [place] [address]    Report Status  PENDING  Incomplete  Blood culture (routine x 2)     Status: None (Preliminary result)   Collection Time: 03/25/20  1:51 PM   Specimen: BLOOD RIGHT ARM   Result Value Ref Range Status   Specimen Description   Final    BLOOD RIGHT ARM Performed at [facility], [place] [address] [place], [place] [address]    Special Requests   Final    BOTTLES DRAWN AEROBIC AND ANAEROBIC Blood Culture adequate volume Performed at [place] [address] [place], [place] [address]    Culture   Final    NO GROWTH 3 DAYS Performed at [facility], [place] [address], [place], [place] [address]    Report Status PENDING  Incomplete  SARS Coronavirus 2 by RT PCR (hospital order, performed in [facility] hospital lab) Nasopharyngeal Nasopharyngeal Swab     Status: None   Collection Time: 03/25/20  4:19 PM   Specimen: Nasopharyngeal Swab  Result Value Ref Range Status   SARS Coronavirus 2 NEGATIVE NEGATIVE Final    Comment: (NOTE) SARS-CoV-2 target nucleic acids are NOT DETECTED.  The SARS-CoV-2 RNA is generally detectable in upper and lower respiratory specimens during the acute phase of infection. The lowest concentration of SARS-CoV-2 viral copies this assay can detect is 250 copies / mL. A negative result does not preclude SARS-CoV-2 infection and should not be used as the sole basis for treatment or other patient management decisions.  A negative result may occur with improper specimen collection / handling, submission of specimen other than nasopharyngeal swab, presence of viral mutation(s) within the areas targeted by this assay, and inadequate number of viral copies (<250 copies / mL). A negative result must be combined with clinical observations, patient history, and epidemiological information.  Fact Sheet for Patients:   StrictlyIdeas.no  Fact Sheet for Healthcare Providers: BankingDealers.co.za  This test is not yet approved or  cleared by the [place] FDA and has been authorized for detection and/or diagnosis of SARS-CoV-2 by FDA under an Emergency Use  Authorization (EUA).  This EUA will remain in effect (meaning this test can be used) for the duration of the COVID-19 declaration under Section 564(b)(1) of the Act, 21 U.S.C. section 360bbb-3(b)(1), unless the authorization is terminated or revoked sooner.  Performed at [facility], [place] [address] [place],  [address]     Please note: You were cared for by a hospitalist during your hospital stay. Once you are discharged, your primary care physician will handle any further medical issues. Please note that NO REFILLS for any discharge medications will be authorized once you are discharged, as it is imperative that you return to your primary care physician (or establish a relationship with a primary care physician if you do not have one) for your post hospital discharge needs so that they can reassess your need for medications and monitor your lab values.    Time coordinating discharge: 40 minutes  SIGNED:   Shelly Coss, MD  [facility] 03/28/2020, 2:27 PM Pager [phone]  If 7PM-7AM, please contact night-coverage [email or web address] Password [email or web address]

## 2020-03-28 NOTE — Progress Notes (Signed)
Pharmacy Antibiotic Note  Simcha Speir is a 86 y.o. male admitted on 03/25/2020 with Streptococcal bacteremia, unclear source. Pharmacy has been consulted for Penicillin G dosing.  03/28/20 8:28 AM   SCr relatively stable  Afebrile  WBC wnl  Plan: Continue penicillin G 10 million continuous infusion q 12 hours Monitor renal function, cultures, clinical course  Height: 5\' 7"  (170.2 cm) Weight: 68 kg (150 lb) IBW/kg (Calculated) : 66.1  Temp (24hrs), Avg:98.4 F (36.9 C), Min:98.2 F (36.8 C), Max:98.6 F (37 C)  Recent Labs  Lab 03/24/20 0928 03/24/20 1441 03/25/20 1346 03/25/20 1546 03/26/20 0915 03/27/20 0758 03/28/20 0542  WBC 13.9*  --  17.6*  --  10.8* 8.3 8.6  CREATININE 1.19  --  1.11  --  1.03 0.96 1.08  LATICACIDVEN 2.0* 2.3* 2.1* 1.7  --   --   --     Estimated Creatinine Clearance: 45.1 mL/min (by C-G formula based on SCr of 1.08 mg/dL).    No Known Allergies  Antimicrobials this admission: 8/22 Ceftriaxone x 1 8/22 Vancomycin x 1 8/23 Penicillin G >>  Dose adjustments this admission: --  Microbiology results: 8/21 BCx: 1/2 sets Streptococcus agalactiae pan-sens except clindamycin and erythromycin 8/21 UCx: reincubated for better growth  8/22 BCx: ngtd 8/22 UCx: ngf 8/22 COVID: negative     Thank you for allowing pharmacy to be a part of this patient's care.  Ulice Dash D 03/28/2020 8:26 AM

## 2020-03-28 NOTE — TOC Transition Note (Signed)
Transition of Care [facility]) - CM/SW Discharge Note   Patient Details  Name: Justin Lester MRN: [id number] Date of Birth: [birth year]  Transition of Care [facility]) CM/SW Contact:  Shade Flood, LCSW Phone Number: 03/28/2020, 3:15 PM   Clinical Narrative:     Pt stable for dc today per MD. HHPT orders placed for dc. Spoke with pt's wife, Lucita Ferrara, over the phone to discuss. Per Lucita Ferrara, she isn't sure if pt will need this or not. Reviewed pt's hospital PT note with Lucita Ferrara who states that they do have a caregiver in the evening who comes out to help pt get ready for bed. She states that the provider comes from [facility]. TOC suggested that we give [facility] the PT referral and if once pt is home it does not appear pt needs the PT, Lucita Ferrara can always cancel the service.  Lois agreeable to this plan. Referral placed with Amy at [facility]. There are no other TOC needs for dc.    Final next level of care: Home w Home Health Services Barriers to Discharge: Barriers Resolved   Patient Goals and CMS Choice   CMS Medicare.gov Compare Post Acute Care list provided to:: Patient Represenative (must comment) Choice offered to / list presented to : Spouse  Discharge Placement                       Discharge Plan and Services                          HH Arranged: PT [facility] Agency: [facility] Date [place]: 03/28/20   Representative spoke with at [place]: Amy  Social Determinants of Health ([place]) Interventions     Readmission Risk Interventions No flowsheet data found.

## 2020-03-28 NOTE — Evaluation (Signed)
Occupational Therapy Evaluation Patient Details Name: Justin Lester MRN: [id number] DOB: 1930-08-17 Today's Date: 03/28/2020    History of Present Illness 86 yo male admitted with bacteremia, R shoulder pain, back pain. Hx of Alz, prostate ca, RBBB, AAA   Clinical Impression   Mr. Trapper Meech is an 86 year old man admitted to hospital with complaints of shoulder and back pain and admitted with positive blood cultures. On evaluation patient reports pain in right shoulder has resolved and demonstrates normal ROM and strength bilaterally. Patient demonstrates ability to ambulate in room and perform toilet transfer with supervision only without dme or loss of balance. Patient demonstrated ability to perform lower body dressing, toileting and standing grooming tasks without physical assistance and only with verbal cues for sequencing. Patient appears to be at his balance. No OT needs at discharge. 24/7 supervision for safety recommended.    Follow Up Recommendations  No OT follow up    Equipment Recommendations  None recommended by OT    Recommendations for Other Services       Precautions / Restrictions Precautions Precautions: None Restrictions Weight Bearing Restrictions: No      Mobility Bed Mobility               General bed mobility comments: seated in recliner.  Transfers Overall transfer level: Independent Equipment used: None             General transfer comment: supervision to stand and ambulate to bathroom and perform toilet transfer. No loss of balance or unsafe movement.    Balance Overall balance assessment: No apparent balance deficits (not formally assessed)             Standing balance comment: No DME required. Able to tolerate three anterior and one posterior nudge.                           ADL either performed or assessed with clinical judgement   ADL Overall ADL's : Independent;At baseline                                        General ADL Comments: Patient able to perform lower body dressing, toileting, toilet transfer and standing grooming task without assistance. Verbal cues for safety and sequencing task due to dementia.     Vision   Vision Assessment?: No apparent visual deficits     Perception     Praxis      Pertinent Vitals/Pain Pain Assessment: No/denies pain     Hand Dominance     Extremity/Trunk Assessment Upper Extremity Assessment Upper Extremity Assessment: RUE deficits/detail;LUE deficits/detail RUE Deficits / Details: 5/5 strength RUE Sensation: WNL RUE Coordination: WNL LUE Deficits / Details: 5/5 strength LUE Sensation: WNL LUE Coordination: WNL   Lower Extremity Assessment Lower Extremity Assessment: Defer to PT evaluation   Cervical / Trunk Assessment Cervical / Trunk Assessment: Normal   Communication Communication Communication: No difficulties   Cognition Arousal/Alertness: Awake/alert Behavior During Therapy: WFL for tasks assessed/performed Overall Cognitive Status: History of cognitive impairments - at baseline                                     General Comments       Exercises     Shoulder Instructions  Home Living Family/patient expects to be discharged to:: Private residence Living Arrangements: Spouse/significant other Available Help at Discharge: Family Type of Home: House Home Access: Stairs to enter Technical brewer of Steps: 1   Home Layout: [place]: None          Prior Functioning/Environment Level of Independence: Independent        Comments: Independent with ADLs. Spouse predominantly home and providing 24/7 supervision.        OT Problem List:        OT Treatment/Interventions:      OT Goals(Current goals can be found in the care plan section) Acute Rehab OT Goals Patient Stated Goal: to go home today OT Goal Formulation: All  assessment and education complete, DC therapy  OT Frequency:     Barriers to D/C:            Co-evaluation              AM-PAC OT "6 Clicks" Daily Activity     Outcome Measure Help from another person eating meals?: None Help from another person taking care of personal grooming?: None Help from another person toileting, which includes using toliet, bedpan, or urinal?: None Help from another person bathing (including washing, rinsing, drying)?: None Help from another person to put on and taking off regular upper body clothing?: None Help from another person to put on and taking off regular lower body clothing?: None 6 Click Score: 24   End of Session Nurse Communication:  (okay to see per RN)  Activity Tolerance: Patient tolerated treatment well Patient left: in chair;with call bell/phone within reach;with chair alarm set;with family/visitor present  OT Visit Diagnosis: Pain;Muscle weakness (generalized) (M62.81) Pain - Right/Left: Right Pain - part of body: Shoulder                Time: 5883-2549 OT Time Calculation (min): 11 min Charges:  OT General Charges $OT Visit: 1 Visit OT Evaluation $OT Eval Low Complexity: 1 Low  Zymeir Salminen, OTR/L [place]  Office [phone] Pager: [phone]   Lenward Chancellor 03/28/2020, 3:44 PM

## 2020-03-29 DIAGNOSIS — B001 Herpesviral vesicular dermatitis: Secondary | ICD-10-CM | POA: Diagnosis not present

## 2020-03-29 DIAGNOSIS — I10 Essential (primary) hypertension: Secondary | ICD-10-CM | POA: Diagnosis not present

## 2020-03-29 DIAGNOSIS — F0390 Unspecified dementia without behavioral disturbance: Secondary | ICD-10-CM | POA: Diagnosis not present

## 2020-03-29 DIAGNOSIS — M25511 Pain in right shoulder: Secondary | ICD-10-CM | POA: Diagnosis not present

## 2020-03-29 DIAGNOSIS — R7881 Bacteremia: Secondary | ICD-10-CM | POA: Diagnosis not present

## 2020-03-29 DIAGNOSIS — I2510 Atherosclerotic heart disease of native coronary artery without angina pectoris: Secondary | ICD-10-CM | POA: Diagnosis not present

## 2020-03-29 DIAGNOSIS — B955 Unspecified streptococcus as the cause of diseases classified elsewhere: Secondary | ICD-10-CM | POA: Diagnosis not present

## 2020-03-29 LAB — CULTURE, BLOOD (ROUTINE X 2): Culture: NO GROWTH

## 2020-03-30 DIAGNOSIS — F0281 Dementia in other diseases classified elsewhere with behavioral disturbance: Secondary | ICD-10-CM | POA: Diagnosis not present

## 2020-03-30 DIAGNOSIS — G309 Alzheimer's disease, unspecified: Secondary | ICD-10-CM | POA: Diagnosis not present

## 2020-03-30 DIAGNOSIS — I10 Essential (primary) hypertension: Secondary | ICD-10-CM | POA: Diagnosis not present

## 2020-03-30 LAB — CULTURE, BLOOD (ROUTINE X 2)
Culture: NO GROWTH
Culture: NO GROWTH
Special Requests: ADEQUATE
Special Requests: ADEQUATE

## 2020-04-04 DIAGNOSIS — F0281 Dementia in other diseases classified elsewhere with behavioral disturbance: Secondary | ICD-10-CM | POA: Diagnosis not present

## 2020-04-04 DIAGNOSIS — I10 Essential (primary) hypertension: Secondary | ICD-10-CM | POA: Diagnosis not present

## 2020-04-04 DIAGNOSIS — G309 Alzheimer's disease, unspecified: Secondary | ICD-10-CM | POA: Diagnosis not present

## 2020-04-05 DIAGNOSIS — G309 Alzheimer's disease, unspecified: Secondary | ICD-10-CM | POA: Diagnosis not present

## 2020-04-05 DIAGNOSIS — F0281 Dementia in other diseases classified elsewhere with behavioral disturbance: Secondary | ICD-10-CM | POA: Diagnosis not present

## 2020-04-05 DIAGNOSIS — I10 Essential (primary) hypertension: Secondary | ICD-10-CM | POA: Diagnosis not present

## 2020-04-10 DIAGNOSIS — R21 Rash and other nonspecific skin eruption: Secondary | ICD-10-CM | POA: Diagnosis not present

## 2020-04-10 DIAGNOSIS — W5311XA Bitten by rat, initial encounter: Secondary | ICD-10-CM | POA: Diagnosis not present

## 2020-04-10 DIAGNOSIS — Z23 Encounter for immunization: Secondary | ICD-10-CM | POA: Diagnosis not present

## 2020-04-11 DIAGNOSIS — Z79899 Other long term (current) drug therapy: Secondary | ICD-10-CM | POA: Diagnosis not present

## 2020-04-11 DIAGNOSIS — I10 Essential (primary) hypertension: Secondary | ICD-10-CM | POA: Diagnosis not present

## 2020-04-11 DIAGNOSIS — Z636 Dependent relative needing care at home: Secondary | ICD-10-CM

## 2020-04-11 DIAGNOSIS — G309 Alzheimer's disease, unspecified: Secondary | ICD-10-CM | POA: Diagnosis not present

## 2020-04-11 DIAGNOSIS — F0281 Dementia in other diseases classified elsewhere with behavioral disturbance: Secondary | ICD-10-CM | POA: Diagnosis not present

## 2020-04-11 HISTORY — DX: Dependent relative needing care at home: Z63.6

## 2020-04-13 DIAGNOSIS — I10 Essential (primary) hypertension: Secondary | ICD-10-CM | POA: Diagnosis not present

## 2020-04-13 DIAGNOSIS — G309 Alzheimer's disease, unspecified: Secondary | ICD-10-CM | POA: Diagnosis not present

## 2020-04-13 DIAGNOSIS — F0281 Dementia in other diseases classified elsewhere with behavioral disturbance: Secondary | ICD-10-CM | POA: Diagnosis not present

## 2020-04-14 DIAGNOSIS — R7881 Bacteremia: Secondary | ICD-10-CM | POA: Diagnosis not present

## 2020-04-14 DIAGNOSIS — R3981 Functional urinary incontinence: Secondary | ICD-10-CM | POA: Diagnosis not present

## 2020-04-14 DIAGNOSIS — F0281 Dementia in other diseases classified elsewhere with behavioral disturbance: Secondary | ICD-10-CM | POA: Diagnosis not present

## 2020-04-14 DIAGNOSIS — M25511 Pain in right shoulder: Secondary | ICD-10-CM | POA: Diagnosis not present

## 2020-04-14 DIAGNOSIS — G309 Alzheimer's disease, unspecified: Secondary | ICD-10-CM | POA: Diagnosis not present

## 2020-04-14 DIAGNOSIS — A491 Streptococcal infection, unspecified site: Secondary | ICD-10-CM | POA: Diagnosis not present

## 2020-04-14 DIAGNOSIS — I10 Essential (primary) hypertension: Secondary | ICD-10-CM | POA: Diagnosis not present

## 2020-04-14 DIAGNOSIS — I25111 Atherosclerotic heart disease of native coronary artery with angina pectoris with documented spasm: Secondary | ICD-10-CM | POA: Diagnosis not present

## 2020-04-16 DIAGNOSIS — G309 Alzheimer's disease, unspecified: Secondary | ICD-10-CM | POA: Diagnosis not present

## 2020-04-16 DIAGNOSIS — F0281 Dementia in other diseases classified elsewhere with behavioral disturbance: Secondary | ICD-10-CM | POA: Diagnosis not present

## 2020-04-16 DIAGNOSIS — M25511 Pain in right shoulder: Secondary | ICD-10-CM | POA: Diagnosis not present

## 2020-04-16 DIAGNOSIS — A491 Streptococcal infection, unspecified site: Secondary | ICD-10-CM | POA: Diagnosis not present

## 2020-04-16 DIAGNOSIS — I10 Essential (primary) hypertension: Secondary | ICD-10-CM | POA: Diagnosis not present

## 2020-04-16 DIAGNOSIS — R7881 Bacteremia: Secondary | ICD-10-CM | POA: Diagnosis not present

## 2020-04-18 ENCOUNTER — Other Ambulatory Visit: Payer: Self-pay | Admitting: Cardiology

## 2020-04-25 DIAGNOSIS — I10 Essential (primary) hypertension: Secondary | ICD-10-CM | POA: Diagnosis not present

## 2020-04-25 DIAGNOSIS — G309 Alzheimer's disease, unspecified: Secondary | ICD-10-CM | POA: Diagnosis not present

## 2020-04-25 DIAGNOSIS — M25511 Pain in right shoulder: Secondary | ICD-10-CM | POA: Diagnosis not present

## 2020-04-25 DIAGNOSIS — A491 Streptococcal infection, unspecified site: Secondary | ICD-10-CM | POA: Diagnosis not present

## 2020-04-25 DIAGNOSIS — F0281 Dementia in other diseases classified elsewhere with behavioral disturbance: Secondary | ICD-10-CM | POA: Diagnosis not present

## 2020-04-25 DIAGNOSIS — R7881 Bacteremia: Secondary | ICD-10-CM | POA: Diagnosis not present

## 2020-05-01 DIAGNOSIS — A491 Streptococcal infection, unspecified site: Secondary | ICD-10-CM | POA: Diagnosis not present

## 2020-05-01 DIAGNOSIS — I10 Essential (primary) hypertension: Secondary | ICD-10-CM | POA: Diagnosis not present

## 2020-05-01 DIAGNOSIS — M25511 Pain in right shoulder: Secondary | ICD-10-CM | POA: Diagnosis not present

## 2020-05-01 DIAGNOSIS — R7881 Bacteremia: Secondary | ICD-10-CM | POA: Diagnosis not present

## 2020-05-01 DIAGNOSIS — F0281 Dementia in other diseases classified elsewhere with behavioral disturbance: Secondary | ICD-10-CM | POA: Diagnosis not present

## 2020-05-01 DIAGNOSIS — G309 Alzheimer's disease, unspecified: Secondary | ICD-10-CM | POA: Diagnosis not present

## 2020-05-09 DIAGNOSIS — A491 Streptococcal infection, unspecified site: Secondary | ICD-10-CM | POA: Diagnosis not present

## 2020-05-09 DIAGNOSIS — I10 Essential (primary) hypertension: Secondary | ICD-10-CM | POA: Diagnosis not present

## 2020-05-09 DIAGNOSIS — F0281 Dementia in other diseases classified elsewhere with behavioral disturbance: Secondary | ICD-10-CM | POA: Diagnosis not present

## 2020-05-09 DIAGNOSIS — M25511 Pain in right shoulder: Secondary | ICD-10-CM | POA: Diagnosis not present

## 2020-05-09 DIAGNOSIS — R7881 Bacteremia: Secondary | ICD-10-CM | POA: Diagnosis not present

## 2020-05-09 DIAGNOSIS — G309 Alzheimer's disease, unspecified: Secondary | ICD-10-CM | POA: Diagnosis not present

## 2020-06-04 ENCOUNTER — Encounter: Payer: Self-pay | Admitting: Podiatry

## 2020-06-04 ENCOUNTER — Ambulatory Visit (INDEPENDENT_AMBULATORY_CARE_PROVIDER_SITE_OTHER): Payer: Medicare Other

## 2020-06-04 ENCOUNTER — Ambulatory Visit (INDEPENDENT_AMBULATORY_CARE_PROVIDER_SITE_OTHER): Payer: Medicare Other | Admitting: Podiatry

## 2020-06-04 ENCOUNTER — Other Ambulatory Visit: Payer: Self-pay

## 2020-06-04 DIAGNOSIS — M205X1 Other deformities of toe(s) (acquired), right foot: Secondary | ICD-10-CM

## 2020-06-04 DIAGNOSIS — L909 Atrophic disorder of skin, unspecified: Secondary | ICD-10-CM

## 2020-06-04 DIAGNOSIS — M2061 Acquired deformities of toe(s), unspecified, right foot: Secondary | ICD-10-CM

## 2020-06-04 NOTE — Progress Notes (Signed)
  Subjective:  Patient ID: Justin Lester, male    DOB: 1930-10-24,  MRN: [id number]  Chief Complaint  Patient presents with  . Callouses    corn on the 2nd toe right and the bunion hurts some too    86 y.o. male presents with the above complaint. History confirmed with patient.  Has pain in multiple areas of both feet with calluses  Objective:  Physical Exam: warm, good capillary refill, no trophic changes or ulcerative lesions, normal DP and PT pulses and normal sensory exam. Left Foot: H PK lateral hallux IPJ left, medial second toe PIPJ, hallux valgus deformity noted Right Foot: Pain and crepitus on first metatarsophalangeal joint range of motion  No images are attached to the encounter.  Radiographs: X-ray of right foot: no fracture, dislocation, swelling or degenerative changes noted.  First metatarsophalangeal joint degenerative changes Assessment:   1. Acquired deformity of right toe   2. Fat pad atrophy of foot   3. Hallux limitus, right      Plan:  Patient was evaluated and treated and all questions answered.  Multiple pre-ulcers, fat pad atrophy -Lesions were courtesy debrided -Discussed padding and proper shoe gear including memory foam sneakers to prevent further worsening -Follow-up as needed.   No follow-ups on file.

## 2020-06-25 DIAGNOSIS — R300 Dysuria: Secondary | ICD-10-CM | POA: Diagnosis not present

## 2020-07-16 DIAGNOSIS — G301 Alzheimer's disease with late onset: Secondary | ICD-10-CM | POA: Diagnosis not present

## 2020-07-16 DIAGNOSIS — G309 Alzheimer's disease, unspecified: Secondary | ICD-10-CM | POA: Diagnosis not present

## 2020-07-16 DIAGNOSIS — F0281 Dementia in other diseases classified elsewhere with behavioral disturbance: Secondary | ICD-10-CM | POA: Diagnosis not present

## 2020-08-28 DIAGNOSIS — G309 Alzheimer's disease, unspecified: Secondary | ICD-10-CM | POA: Diagnosis not present

## 2020-08-31 DIAGNOSIS — I6523 Occlusion and stenosis of bilateral carotid arteries: Secondary | ICD-10-CM | POA: Diagnosis not present

## 2020-08-31 DIAGNOSIS — I714 Abdominal aortic aneurysm, without rupture: Secondary | ICD-10-CM | POA: Diagnosis not present

## 2020-08-31 DIAGNOSIS — I25119 Atherosclerotic heart disease of native coronary artery with unspecified angina pectoris: Secondary | ICD-10-CM | POA: Diagnosis not present

## 2020-08-31 DIAGNOSIS — I10 Essential (primary) hypertension: Secondary | ICD-10-CM | POA: Diagnosis not present

## 2020-08-31 DIAGNOSIS — Z8679 Personal history of other diseases of the circulatory system: Secondary | ICD-10-CM | POA: Diagnosis not present

## 2020-08-31 DIAGNOSIS — Z9889 Other specified postprocedural states: Secondary | ICD-10-CM | POA: Diagnosis not present

## 2020-09-18 DIAGNOSIS — G301 Alzheimer's disease with late onset: Secondary | ICD-10-CM | POA: Diagnosis not present

## 2020-10-01 DIAGNOSIS — G309 Alzheimer's disease, unspecified: Secondary | ICD-10-CM | POA: Diagnosis not present

## 2020-10-01 DIAGNOSIS — D171 Benign lipomatous neoplasm of skin and subcutaneous tissue of trunk: Secondary | ICD-10-CM | POA: Diagnosis not present

## 2020-10-09 ENCOUNTER — Other Ambulatory Visit: Payer: Self-pay | Admitting: Cardiology

## 2020-10-18 ENCOUNTER — Ambulatory Visit: Payer: Medicare Other | Admitting: Podiatry

## 2020-10-18 ENCOUNTER — Other Ambulatory Visit: Payer: Self-pay

## 2020-10-18 DIAGNOSIS — M2061 Acquired deformities of toe(s), unspecified, right foot: Secondary | ICD-10-CM

## 2020-10-18 DIAGNOSIS — L909 Atrophic disorder of skin, unspecified: Secondary | ICD-10-CM | POA: Diagnosis not present

## 2020-10-18 DIAGNOSIS — M205X1 Other deformities of toe(s) (acquired), right foot: Secondary | ICD-10-CM | POA: Diagnosis not present

## 2020-10-18 DIAGNOSIS — B353 Tinea pedis: Secondary | ICD-10-CM

## 2020-10-18 MED ORDER — KETOCONAZOLE 2 % EX CREA: TOPICAL_CREAM | CUTANEOUS | 0 refills | Status: DC

## 2020-10-18 NOTE — Progress Notes (Signed)
  Subjective:  Patient ID: Justin Lester, male    DOB: April 06, 1931,  MRN: [id number]  Chief Complaint  Patient presents with  . Callouses    Right foot callus 5th toe-  Left 2nd toe- lesion in between toes     87 y.o. male presents with the above complaint. History confirmed with patient.  Calluses have come back and are causing pain. States that they got new pairs of shoes yesterday are trying to have him wear them around the house.  Objective:  Physical Exam: warm, good capillary refill, no trophic changes or ulcerative lesions, normal DP and PT pulses and normal sensory exam. Left Foot: H PK lateral hallux IPJ left, medial second toe PIPJ, hallux valgus deformity noted Right Foot: Pain and crepitus on first metatarsophalangeal joint range of motion  Assessment:   1. Acquired deformity of right toe   2. Fat pad atrophy of foot   3. Hallux limitus, right   4. Tinea pedis of both feet    Plan:  Patient was evaluated and treated and all questions answered.  Multiple pre-ulcers, fat pad atrophy -Lesions were courtesy debrided -Discussed proper shoe gear. Current shoes not padded enough. Dispense toe spacer for L foot bunion and interdigital lesions. -Follow-up as needed.   Return if symptoms worsen or fail to improve.

## 2020-11-26 DIAGNOSIS — E785 Hyperlipidemia, unspecified: Secondary | ICD-10-CM | POA: Diagnosis not present

## 2020-11-26 DIAGNOSIS — Z9181 History of falling: Secondary | ICD-10-CM | POA: Diagnosis not present

## 2020-11-26 DIAGNOSIS — Z0000 Encounter for general adult medical examination without abnormal findings: Secondary | ICD-10-CM | POA: Diagnosis not present

## 2021-01-08 DIAGNOSIS — R609 Edema, unspecified: Secondary | ICD-10-CM | POA: Diagnosis not present

## 2021-02-08 DIAGNOSIS — I252 Old myocardial infarction: Secondary | ICD-10-CM | POA: Insufficient documentation

## 2021-02-08 DIAGNOSIS — C61 Malignant neoplasm of prostate: Secondary | ICD-10-CM | POA: Insufficient documentation

## 2021-02-08 DIAGNOSIS — K219 Gastro-esophageal reflux disease without esophagitis: Secondary | ICD-10-CM | POA: Insufficient documentation

## 2021-02-11 ENCOUNTER — Other Ambulatory Visit: Payer: Self-pay

## 2021-02-12 NOTE — Progress Notes (Signed)
Cardiology Office Note:    Date:  02/13/2021   ID:  Justin Lester, DOB 03-Nov-1930, MRN [id number]  PCP:  [name], MD  Cardiologist:  Shirlee More, MD    Referring MD: [name], MD    ASSESSMENT:    1. Coronary artery disease involving native coronary artery of native heart with angina pectoris ([name])   2. Hypertensive heart disease, unspecified whether heart failure present   3. Hyperlipidemia, unspecified hyperlipidemia type    PLAN:    In order of problems listed above:  Fortunately for Reco his CAD is stable following surgical revascularization having no angina and I think in this situation the best withdrawal the low-dose of a beta-blocker and continue treatment aspirin statin ACE inhibitor and I would not advise an ischemia evaluation Blood pressure is at target continue treatment low-dose ARB ACE Lipids at target continue his high intensity statin most recent lab was shown LDL of 75 cholesterol 147   Next appointment: 1 year   Medication Adjustments/Labs and Tests Ordered: Current medicines are reviewed at length with the patient today.  Concerns regarding medicines are outlined above.  Orders Placed This Encounter  Procedures   EKG 12-Lead   No orders of the defined types were placed in this encounter.   Chief complaint: Follow-up CAD   History of Present Illness:    [name] is a 87 y.o. male with a hx of coronary artery disease history of CABG hypertensive heart disease with heart failure dyslipidemia and abdominal aortic aneurysm with EVAR last seen 02/14/2020.[name] Compliance with diet, lifestyle and medications: Yes  Sadly has had a profound change in the last year and his dementia is very severe and he has incontinence. His wife is struggling to take care of him and is confused why has had such a rapid progression in such a short period of time. She asked me for referral for second opinion I gave her the name of Dr. Arlice Colt [facility]  neurology. Fortunately from a cardiac perspective [name] does well he is having no palpitations syncope edema shortness of breath or chest pain.  He has resting bradycardia and with his progressive dementia I [name] stop his low-dose of the beta-blocker his CAD is stable.  He was admitted to [facility] August 2021 with fever temperature of 103 acidosis leukocytosis and bacteremia.  He was found to have no findings of septic arthritis was treated with antibiotics and discharged from the hospital.  Echocardiogram done [facility] 03/26/2020 to exclude endocarditis showed no findings of valvular destruction or vegetation.  EF normal 60 to 65% normal right ventricular size function he had trivial mitral regurgitation mild to moderate tricuspid regurgitation. Past Medical History:  Diagnosis Date   Abdominal aortic aneurysm without rupture ([place]) 07/14/2016   Overview:  s/p EVAR 06/2015   Alzheimer's dementia with behavioral disturbance ([place]) 03/07/2020   Bilateral carotid artery stenosis 07/14/2016   Caregiver stress 04/11/2020   Continuous leakage of urine 06/10/2019   Coronary artery disease involving native coronary artery of native heart with angina pectoris ([place]) 04/26/2015   Overview:  CABG was done April 2006 at [facility], LTA to LAD and 2 SVG's to D and LCF Treadmill stress EKG negative for ischemia at 11 Mets Oct 2016   Dementia [facility])    Esophageal reflux    Essential hypertension 04/26/2015   H/O aortic aneurysm repair 08/05/2018   Hyperlipidemia 04/26/2015   Metatarsalgia of both feet 11/24/2018   Old myocardial infarct  Pre-ulcerative calluses 11/24/2018   Prostate cancer ([place])    RBBB 04/26/2015   Ruptured abdominal aortic aneurysm ([place]) 04/26/2015   Overview:  NOT RUPTURED YET 55 by 55 mm size   SDAT (senile dementia of Alzheimer's type) ([place]) 06/10/2019   Streptococcal bacteremia 03/25/2020   Toenail fungus 11/24/2018   Urinary incontinence     Past Surgical History:  Procedure  Laterality Date   ABDOMINAL AORTA STENT     CORONARY ARTERY BYPASS GRAFT     HERNIA REPAIR     PROSTATE SURGERY      Current Medications: Current Meds  Medication Sig   acetaminophen (TYLENOL) 325 MG tablet Take 2 tablets (650 mg total) by mouth every 6 (six) hours as needed for mild pain (or Fever >/= 101).   aspirin EC 81 MG tablet Take 81 mg by mouth as directed. Take on MWF   atorvastatin (LIPITOR) 10 MG tablet TAKE 1 TABLET BY MOUTH  DAILY   Cholecalciferol (VITAMIN D PO) Take 2,000 Units by mouth daily.   Co-Enzyme Q-10 30 MG CAPS Take 10 mg by mouth daily.   diclofenac Sodium (VOLTAREN) 1 % GEL Apply 2 g topically 4 (four) times daily as needed (pain).   ibuprofen (ADVIL) 800 MG tablet Take 800 mg by mouth every 8 (eight) hours as needed for moderate pain.    ketoconazole (NIZORAL) 2 % cream Apply 1 fingertip amount to each foot daily.   magnesium oxide (MAG-OX) 400 MG tablet Take 400 mg by mouth daily.   Memantine HCl-Donepezil HCl 28-10 MG CP24 Take 1 capsule by mouth daily.   Multiple Vitamin (MULTI-VITAMINS) TABS Take 1 tablet by mouth daily.   ramipril (ALTACE) 2.5 MG capsule TAKE 1 CAPSULE BY MOUTH  DAILY   risperiDONE (RISPERDAL) 0.5 MG tablet Take 0.5 mg by mouth 2 (two) times daily.   traZODone (DESYREL) 50 MG tablet Take 50 mg by mouth at bedtime.    valACYclovir (VALTREX) 1000 MG tablet Take 1,000 mg by mouth 3 (three) times daily.   vitamin B-12 (CYANOCOBALAMIN) 1000 MCG tablet Take 1 tablet by mouth daily.   Zinc 50 MG CAPS Take 50 mg by mouth daily.   [DISCONTINUED] metoprolol succinate (TOPROL-XL) 50 MG 24 hr tablet Take 0.5 tablets (25 mg total) by mouth daily. Take with or immediately following a meal.     Allergies:   Patient has no known allergies.   Social History   Socioeconomic History   Marital status: Married    Spouse name: Not on file   Number of children: Not on file   Years of education: Not on file   Highest education level: Not on file   Occupational History   Not on file  Tobacco Use   Smoking status: Former    Pack years: 0.00   Smokeless tobacco: Never  Vaping Use   Vaping Use: Never used  Substance and Sexual Activity   Alcohol use: No   Drug use: No   Sexual activity: Not on file  Other Topics Concern   Not on file  Social History Narrative   Not on file   Social Determinants of Health   Financial Resource Strain: Not on file  Food Insecurity: Not on file  Transportation Needs: Not on file  Physical Activity: Not on file  Stress: Not on file  Social Connections: Not on file     Family History: The patient's family history includes CAD in his brother, mother, and sister. ROS:   Please see  the history of present illness.    All other systems reviewed and are negative.  EKGs/Labs/Other Studies Reviewed:    The following studies were reviewed today:  EKG:  EKG ordered today and personally reviewed.  The ekg ordered today demonstrates sinus rhythm and right bundle branch block otherwise normal  Recent Labs: 03/26/2020: ALT 17 03/28/2020: BUN 12; Creatinine, Ser 1.08; Hemoglobin 16.3; Magnesium 2.0; Platelets 172; Potassium 3.7; Sodium 139  Recent Lipid Panel    Component Value Date/Time   CHOL 147 02/14/2020 1335   TRIG 177 (H) 02/14/2020 1335   HDL 42 02/14/2020 1335   CHOLHDL 3.5 02/14/2020 1335   LDLCALC 75 02/14/2020 1335    Physical Exam:    VS:  BP 106/62 (BP Location: Right Arm, Patient Position: Sitting, Cuff Size: Normal)   Pulse (!) 58   Ht 5\' 7"  (1.702 m)   Wt 167 lb 12.8 oz (76.1 kg)   SpO2 97%   BMI 26.28 kg/m     Wt Readings from Last 3 Encounters:  02/13/21 167 lb 12.8 oz (76.1 kg)  03/25/20 150 lb (68 kg)  02/14/20 164 lb (74.4 kg)     GEN:  Well nourished, well developed in no acute distress HEENT: Normal NECK: No JVD; No carotid bruits LYMPHATICS: No lymphadenopathy CARDIAC: RRR, no murmurs, rubs, gallops RESPIRATORY:  Clear to auscultation without rales,  wheezing or rhonchi  ABDOMEN: Soft, non-tender, non-distended MUSCULOSKELETAL:  No edema; No deformity  SKIN: Warm and dry NEUROLOGIC:  Alert and oriented x 3 PSYCHIATRIC:  Normal affect    Signed, Shirlee More, MD  02/13/2021 11:06 AM    [place]

## 2021-02-13 ENCOUNTER — Other Ambulatory Visit: Payer: Self-pay

## 2021-02-13 ENCOUNTER — Encounter: Payer: Self-pay | Admitting: Cardiology

## 2021-02-13 ENCOUNTER — Ambulatory Visit: Payer: Medicare Other | Admitting: Cardiology

## 2021-02-13 VITALS — BP 106/62 | HR 58 | Ht 67.0 in | Wt 167.8 lb

## 2021-02-13 DIAGNOSIS — I119 Hypertensive heart disease without heart failure: Secondary | ICD-10-CM | POA: Diagnosis not present

## 2021-02-13 DIAGNOSIS — I25119 Atherosclerotic heart disease of native coronary artery with unspecified angina pectoris: Secondary | ICD-10-CM

## 2021-02-13 DIAGNOSIS — E785 Hyperlipidemia, unspecified: Secondary | ICD-10-CM

## 2021-02-13 NOTE — Patient Instructions (Signed)
Medication Instructions:  Your physician has recommended you make the following change in your medication:   Stop Metoprolol.  *If you need a refill on your cardiac medications before your next appointment, please call your pharmacy*   Lab Work: None ordered If you have labs (blood work) drawn today and your tests are completely normal, you will receive your results only by: [place] (if you have MyChart) OR A paper copy in the mail If you have any lab test that is abnormal or we need to change your treatment, we will call you to review the results.   Testing/Procedures: None ordered   Follow-Up: At [facility], you and your health needs are our priority.  As part of our continuing mission to provide you with exceptional heart care, we have created designated Provider Care Teams.  These Care Teams include your primary Cardiologist (physician) and Advanced Practice Providers (APPs -  Physician Assistants and Nurse Practitioners) who all work together to provide you with the care you need, when you need it.  We recommend signing up for the patient portal called "MyChart".  Sign up information is provided on this After Visit Summary.  MyChart is used to connect with patients for Virtual Visits (Telemedicine).  Patients are able to view lab/test results, encounter notes, upcoming appointments, etc.  Non-urgent messages can be sent to your provider as well.   To learn more about what you can do with MyChart, go to [email or web address].    Your next appointment:   12 month(s)  The format for your next appointment:   In Person  Provider:   Shirlee More, MD   Other Instructions NA

## 2021-02-14 ENCOUNTER — Encounter: Payer: Self-pay | Admitting: Podiatry

## 2021-02-14 ENCOUNTER — Ambulatory Visit: Payer: Medicare Other | Admitting: Podiatry

## 2021-02-14 DIAGNOSIS — M79672 Pain in left foot: Secondary | ICD-10-CM | POA: Diagnosis not present

## 2021-02-14 DIAGNOSIS — B351 Tinea unguium: Secondary | ICD-10-CM | POA: Diagnosis not present

## 2021-02-14 DIAGNOSIS — M79609 Pain in unspecified limb: Secondary | ICD-10-CM

## 2021-02-14 DIAGNOSIS — Q828 Other specified congenital malformations of skin: Secondary | ICD-10-CM

## 2021-02-14 DIAGNOSIS — M79671 Pain in right foot: Secondary | ICD-10-CM | POA: Diagnosis not present

## 2021-02-14 DIAGNOSIS — L84 Corns and callosities: Secondary | ICD-10-CM | POA: Diagnosis not present

## 2021-02-15 ENCOUNTER — Telehealth: Payer: Self-pay | Admitting: Cardiology

## 2021-02-15 DIAGNOSIS — F0280 Dementia in other diseases classified elsewhere without behavioral disturbance: Secondary | ICD-10-CM

## 2021-02-15 NOTE — Telephone Encounter (Signed)
Referral to Dr. Arlice Colt with Neurologist placed, w/dx of dementia per Dr. Bettina Gavia.

## 2021-02-15 NOTE — Telephone Encounter (Signed)
Pt was seen by Dr. Bettina Gavia on 02/13/21 and was referred to Dr. Felecia Shelling  Neurologist, pt needs a referral sent to Dr. Garth Bigness office [place], [place] Office [phone]

## 2021-02-15 NOTE — Addendum Note (Signed)
Addended by: Wynema Birch on: 02/15/2021 03:35 PM   Modules accepted: Orders

## 2021-02-17 NOTE — Progress Notes (Signed)
Subjective: Justin Lester is a pleasant 87 y.o. male patient seen today painful thick toenails that are difficult to trim. Pain interferes with ambulation. Aggravating factors include wearing enclosed shoe gear. Pain is relieved with periodic professional debridement.  He is accompanied by his wife on today's visit. Patient has h/o dementia. They are celebrating their wedding anniversary on today by attending lunch with her sister and brother in law.  Wife states Justin Lester is c/o pain under callus right foot; also interdigital corn left 2nd toe. She states he has pad for it.  PCP is [name], MD. Last visit was: 10/01/2020.  No Known Allergies  Objective: Physical Exam  General: Justin Lester is a pleasant 87 y.o. African American male, WD, WN in NAD. AAO x 3.   Vascular:  Capillary refill time to digits immediate b/l. Palpable pedal pulses b/l LE. Pedal hair absent. Lower extremity skin temperature gradient within normal limits.  Dermatological:  Pedal skin with normal turgor, texture and tone b/l lower extremities. No open wounds b/l lower extremities. No interdigital macerations b/l lower extremities. Toenails 1-5 b/l elongated, discolored, dystrophic, thickened, crumbly with subungual debris and tenderness to dorsal palpation. Hyperkeratotic lesion(s) L hallux and L 2nd toe.  No erythema, no edema, no drainage, no fluctuance. Porokeratotic lesion(s) submet head 5 right foot. No erythema, no edema, no drainage, no fluctuance.  Musculoskeletal:  Normal muscle strength 5/5 to all lower extremity muscle groups bilaterally. No pain crepitus or joint limitation noted with ROM b/l. Hallux valgus with bunion deformity noted left foot. Hammertoe(s) noted to the L 2nd toe. Plantar fat pad atrophy of forefoot area b/l feet.  Neurological:  Protective sensation intact 5/5 intact bilaterally with 10g monofilament b/l. Vibratory sensation intact b/l.  Assessment and Plan:  1. Pain due to  onychomycosis of nail   2. Corns   3. Porokeratosis   4. Pain in both feet    -Examined patient. -Patient to continue soft, supportive shoe gear daily. -Toenails 1-5 b/l were debrided in length and girth with sterile nail nippers and dremel without iatrogenic bleeding.  -Corn(s) L hallux and L 2nd toe pared utilizing sterile scalpel blade without complication or incident. Total number debrided=2. -Painful porokeratotic lesion(s) submet head 5 right foot pared and enucleated with sterile scalpel blade without incident. Total number of lesions debrided=1. -Patient to report any pedal injuries to medical professional immediately. -Continue protective padding to afftected digits daily for protection. -Patient/POA to call should there be question/concern in the interim.  Return in about 3 months (around 05/17/2021).  Marzetta Board, DPM

## 2021-02-19 DIAGNOSIS — R609 Edema, unspecified: Secondary | ICD-10-CM | POA: Diagnosis not present

## 2021-03-08 DIAGNOSIS — G309 Alzheimer's disease, unspecified: Secondary | ICD-10-CM | POA: Diagnosis not present

## 2021-03-08 DIAGNOSIS — R8290 Unspecified abnormal findings in urine: Secondary | ICD-10-CM | POA: Diagnosis not present

## 2021-04-04 DIAGNOSIS — G2581 Restless legs syndrome: Secondary | ICD-10-CM | POA: Diagnosis not present

## 2021-04-04 DIAGNOSIS — G309 Alzheimer's disease, unspecified: Secondary | ICD-10-CM | POA: Diagnosis not present

## 2021-04-24 DIAGNOSIS — D485 Neoplasm of uncertain behavior of skin: Secondary | ICD-10-CM | POA: Diagnosis not present

## 2021-05-16 ENCOUNTER — Ambulatory Visit: Payer: Medicare Other | Admitting: Neurology

## 2021-05-16 ENCOUNTER — Encounter: Payer: Self-pay | Admitting: Neurology

## 2021-05-16 VITALS — BP 105/70 | HR 78 | Ht 67.0 in | Wt 167.0 lb

## 2021-05-16 DIAGNOSIS — G301 Alzheimer's disease with late onset: Secondary | ICD-10-CM | POA: Diagnosis not present

## 2021-05-16 DIAGNOSIS — R4701 Aphasia: Secondary | ICD-10-CM | POA: Diagnosis not present

## 2021-05-16 DIAGNOSIS — F02C11 Dementia in other diseases classified elsewhere, severe, with agitation: Secondary | ICD-10-CM

## 2021-05-16 MED ORDER — RISPERIDONE 0.5 MG PO TABS
ORAL_TABLET | ORAL | 11 refills | Status: DC
Start: 2021-05-16 — End: 2024-06-28

## 2021-05-16 NOTE — Progress Notes (Signed)
[facility]  PATIENT: Justin Lester DOB: August 03, 1931  REFERRING DOCTOR OR PCP: Shirlee More, MD (cardiology); Dr. Delena Bali MD (PCP) SOURCE: Patient, wife, notes from Dr. Verdene Rio (now retired),  _________________________________   HISTORICAL  CHIEF COMPLAINT:  Chief Complaint  Patient presents with   Follow-up    RM 1 with wife. Paper referral from [facility] for Dementia.     HISTORY OF PRESENT ILLNESS:  I had the pleasure to your patient, Justin Lester, at [facility] for neurologic consultation regarding his dementia.  He is an 87 year old man who was diagnosed with AD in 2017. 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    He slowly worsened and stopped driving 2 years ago.     In May 2022, he had more acute changes in cognition and behavior with more difficulty with speech and doing instrumental ADLs.   He i started to have hallucinations..   He sees men who are in the house and outside.   He is not afraid of them and they are nt causing problems. .  He has 'run away' a few times -- walking away in the middle of the night.     He had initially seen Dr. Verdene Rio.  He was initially on donepezil and then memantine was added.  He is now on memantine/donepezil 28/10 daily.   When Dr. Verdene Rio retired, he briefly saw Dr. Kris Mouton but she has moved.  Dr. Bettina Gavia, his cardiologist is referring hm.     He has word finding difficulty and has trouble saying more complicated thoughts.  He has movements where he seems to be working in the middle of the night.   He sometimes kicks in the night but never gets out of the bed or attacks.   He has more behavior issues, especially later in the day.    He requires constant supervision and his wife is able to distract him if he gets a little agitated.  Risperdal has helped the agitation.     He has become incontinent.  Initially had dripping now requires Depends.   He is  physically active and spends most of the day following his wife around and helping her with the household chores and some gardening.     He falls asleep easily and sleeps 7 hours and does not nap.  He had a skin cancer removed on the left scalp and has a large scar.    His wife notes that his memory problems seemed to start after that.     MRI report 10/18/2015 was personally reviewed and showed atrophy that was most pronounced in the medial temporal lobes.  There is mild chronic microvascular ischemic change.  There were no acute findings.    MMSE - Mini Mental State Exam 05/16/2021  Orientation to time 0  Orientation to Place 0  Registration 3  Attention/ Calculation 0  Recall 0  Language- name 2 objects 0  Language- repeat 0  Language- follow 3 step command 0  Language- read & follow direction 0  Write a sentence 0  Copy design 0  Total score 3    Vitamin B12 was normal on 07/16/2020.  Comprehensive metabolic panel 6/75/9163 was unremarkable.  REVIEW OF SYSTEMS: Constitutional: No fevers, chills, sweats, or change in appetite Eyes: No visual changes, double vision, eye pain Ear, nose and throat: No hearing loss, ear pain, nasal congestion, sore throat Cardiovascular: No chest pain, palpitations Respiratory:  No  shortness of breath at rest or with exertion.   No wheezes GastrointestinaI: No nausea, vomiting, diarrhea, abdominal pain, fecal incontinence Genitourinary:  No dysuria, urinary retention or frequency.  No nocturia. Musculoskeletal:  No neck pain, back pain Integumentary: No rash, pruritus, skin lesions Neurological: as above Psychiatric: No depression at this time.  No anxiety Endocrine: No palpitations, diaphoresis, change in appetite, change in weigh or increased thirst Hematologic/Lymphatic:  No anemia, purpura, petechiae. Allergic/Immunologic: No itchy/runny eyes, nasal congestion, recent allergic reactions, rashes  ALLERGIES: No Known Allergies  HOME  MEDICATIONS:  Current Outpatient Medications:    acetaminophen (TYLENOL) 325 MG tablet, Take 2 tablets (650 mg total) by mouth every 6 (six) hours as needed for mild pain (or Fever >/= 101)., Disp: 30 tablet, Rfl: 1   aspirin EC 81 MG tablet, Take 81 mg by mouth as directed. Take on MWF, Disp: , Rfl:    atorvastatin (LIPITOR) 10 MG tablet, TAKE 1 TABLET BY MOUTH  DAILY, Disp: 90 tablet, Rfl: 3   Cholecalciferol (VITAMIN D PO), Take 2,000 Units by mouth daily., Disp: , Rfl:    Co-Enzyme Q-10 30 MG CAPS, Take 10 mg by mouth daily., Disp: , Rfl:    diclofenac Sodium (VOLTAREN) 1 % GEL, Apply 2 g topically 4 (four) times daily as needed (pain)., Disp: 100 g, Rfl: 1   ibuprofen (ADVIL) 800 MG tablet, Take 800 mg by mouth every 8 (eight) hours as needed for moderate pain. , Disp: , Rfl:    ketoconazole (NIZORAL) 2 % cream, Apply 1 fingertip amount to each foot daily., Disp: 30 g, Rfl: 0   magnesium oxide (MAG-OX) 400 MG tablet, Take 400 mg by mouth daily., Disp: , Rfl:    Memantine HCl-Donepezil HCl 28-10 MG CP24, Take 1 capsule by mouth daily., Disp: , Rfl:    Multiple Vitamin (MULTI-VITAMINS) TABS, Take 1 tablet by mouth daily., Disp: , Rfl:    ramipril (ALTACE) 2.5 MG capsule, TAKE 1 CAPSULE BY MOUTH  DAILY, Disp: 90 capsule, Rfl: 3   risperiDONE (RISPERDAL) 0.5 MG tablet, Take 0.5 mg by mouth 2 (two) times daily., Disp: , Rfl:    traZODone (DESYREL) 50 MG tablet, Take 50 mg by mouth at bedtime. , Disp: , Rfl:    valACYclovir (VALTREX) 1000 MG tablet, Take 1,000 mg by mouth 3 (three) times daily., Disp: , Rfl:    vitamin B-12 (CYANOCOBALAMIN) 1000 MCG tablet, Take 1 tablet by mouth daily., Disp: , Rfl:    Zinc 50 MG CAPS, Take 50 mg by mouth daily., Disp: , Rfl:   PAST MEDICAL HISTORY: Past Medical History:  Diagnosis Date   Abdominal aortic aneurysm without rupture ([place]) 07/14/2016   Overview:  s/p EVAR 06/2015   Alzheimer's dementia with behavioral disturbance ([place]) 03/07/2020   Bilateral  carotid artery stenosis 07/14/2016   Caregiver stress 04/11/2020   Continuous leakage of urine 06/10/2019   Coronary artery disease involving native coronary artery of native heart with angina pectoris ([place]) 04/26/2015   Overview:  CABG was done April 2006 at [facility], LTA to LAD and 2 SVG's to D and LCF Treadmill stress EKG negative for ischemia at 11 [address] Oct 2016   Dementia [facility])    Esophageal reflux    Essential hypertension 04/26/2015   H/O aortic aneurysm repair 08/05/2018   Hyperlipidemia 04/26/2015   Metatarsalgia of both feet 11/24/2018   Old myocardial infarct    Pre-ulcerative calluses 11/24/2018   Prostate cancer ([place])    RBBB 04/26/2015  Ruptured abdominal aortic aneurysm ([place]) 04/26/2015   Overview:  NOT RUPTURED YET 55 by 55 mm size   SDAT (senile dementia of Alzheimer's type) ([place]) 06/10/2019   Streptococcal bacteremia 03/25/2020   Toenail fungus 11/24/2018   Urinary incontinence     PAST SURGICAL HISTORY: Past Surgical History:  Procedure Laterality Date   ABDOMINAL AORTA STENT     CORONARY ARTERY BYPASS GRAFT     HERNIA REPAIR     PROSTATE SURGERY      FAMILY HISTORY: Family History  Problem Relation Age of Onset   CAD Mother    CAD Sister    CAD Brother     SOCIAL HISTORY:  Social History   Socioeconomic History   Marital status: Married    Spouse name: Not on file   Number of children: Not on file   Years of education: Not on file   Highest education level: Not on file  Occupational History   Not on file  Tobacco Use   Smoking status: Former   Smokeless tobacco: Never  Vaping Use   Vaping Use: Never used  Substance and Sexual Activity   Alcohol use: No   Drug use: No   Sexual activity: Not on file  Other Topics Concern   Not on file  Social History Narrative   Not on file   Social Determinants of Health   Financial Resource Strain: Not on file  Food Insecurity: Not on file  Transportation Needs: Not on file  Physical Activity: Not on file   Stress: Not on file  Social Connections: Not on file  Intimate Partner Violence: Not on file     PHYSICAL EXAM  Vitals:   05/16/21 1009  BP: 105/70  Pulse: 78  SpO2: 98%  Weight: 167 lb (75.8 kg)  Height: 5\' 7"  (1.702 m)    Body mass index is 26.16 kg/m.   General: The patient is well-developed and well-nourished and in no acute distress  HEENT:  Head is [place]/AT.  Sclera are anicteric.    Neck: No carotid bruits are noted.  The neck is nontender.  Cardiovascular: The heart has a regular rate and rhythm with a normal S1 and S2. There were no murmurs, gallops or rubs.    Skin: Extremities are without rash or  edema.  Musculoskeletal:  Back is nontender  Neurologic Exam  Mental status: The patient is alert to his name only.  He scored only 3/30 on the Mini-Mental status exam consistent with very severe dementia.  He had no recall several minutes later.  Focus and attention was poor.  Speech showed some word finding difficulties.  He was able to follow one-step commands but not three-step commands.  Cranial nerves: Extraocular movements are full.     Facial symmetry is present. There is good facial sensation to soft touch bilaterally.Facial strength is normal.  Trapezius and sternocleidomastoid strength is normal. No dysarthria is noted.  The tongue is midline, and the patient has symmetric elevation of the soft palate. No obvious hearing deficits are noted.  Motor:  Muscle bulk is normal.   Tone is normal. Strength is  5 / 5 in all 4 extremities.   Sensory: Sensory testing is intact to pinprick, soft touch and vibration sensation in all 4 extremities.  Coordination: Cerebellar testing reveals good finger-nose-finger and heel-to-shin bilaterally.  Gait and station: Station is normal.   Gait is normal for his age. Tandem gait is wide but probably normal for age. Romberg is negative.  Reflexes: Deep tendon reflexes are symmetric and normal bilaterally.   Plantar responses  are flexor.    DIAGNOSTIC DATA (LABS, IMAGING, TESTING) - I reviewed patient records, labs, notes, testing and imaging myself where available.  Lab Results  Component Value Date   WBC 8.6 03/28/2020   HGB 16.3 03/28/2020   HCT 47.4 03/28/2020   MCV 90.8 03/28/2020   PLT 172 03/28/2020      Component Value Date/Time   NA 139 03/28/2020 0542   NA 144 02/14/2020 1335   K 3.7 03/28/2020 0542   CL 103 03/28/2020 0542   CO2 27 03/28/2020 0542   GLUCOSE 117 (H) 03/28/2020 0542   BUN 12 03/28/2020 0542   BUN 21 02/14/2020 1335   CREATININE 1.08 03/28/2020 0542   CALCIUM 9.1 03/28/2020 0542   PROT 5.8 (L) 03/26/2020 0915   PROT 6.8 02/14/2020 1335   ALBUMIN 3.4 (L) 03/26/2020 0915   ALBUMIN 4.7 (H) 02/14/2020 1335   AST 27 03/26/2020 0915   ALT 17 03/26/2020 0915   ALKPHOS 48 03/26/2020 0915   BILITOT 0.8 03/26/2020 0915   BILITOT 0.7 02/14/2020 1335   GFRNONAA >60 03/28/2020 0542   GFRAA >60 03/28/2020 0542   Lab Results  Component Value Date   CHOL 147 02/14/2020   HDL 42 02/14/2020   LDLCALC 75 02/14/2020   TRIG 177 (H) 02/14/2020   CHOLHDL 3.5 02/14/2020       ASSESSMENT AND PLAN  Severe late onset Alzheimer's dementia with agitation ([place]) - Plan: CT HEAD WO CONTRAST (5MM)  Aphasia - Plan: CT HEAD WO CONTRAST (5MM)   In summary, Justin Lester is an 87 year old man with with severe dementia.  Although the dementia was first diagnosed in 2017, he seems to have had a significant worsening of symptoms around May of this year, 5 months ago.  Therefore, I am  concerned that he might also have had a stroke which could explain some of his more recent speech difficulties and worsening cognitive function.  The CT scan in 2017 was most consistent with Alzheimer's disease.  At that time he had some chronic microvascular ischemic change, but nothing unusual for age.  We will continue the memantine and donepezil.  To help with some of the agitation that occurs many evenings, I  will have him increase the p.m. dose of Risperdal from 0.5 mg to 1 mg.  I will let them know the results of the CT scan.  He is scheduled to return in 6 months and they should call sooner if there are any major new or worsening symptoms. Justin Lester A. Felecia Shelling, MD, [facility] 96/75/9163, 84:66 AM Certified in Neurology, Clinical Neurophysiology, Sleep Medicine and Neuroimaging  [facility] [address] [place], [place] [address] [phone]

## 2021-05-20 ENCOUNTER — Telehealth: Payer: Self-pay | Admitting: Neurology

## 2021-05-20 NOTE — Telephone Encounter (Signed)
[employer] medicare no auth require faxed order to [place] they will reach out to the patient to schedule

## 2021-05-30 ENCOUNTER — Encounter: Payer: Self-pay | Admitting: Podiatry

## 2021-05-30 ENCOUNTER — Ambulatory Visit: Payer: Medicare Other | Admitting: Podiatry

## 2021-05-30 ENCOUNTER — Other Ambulatory Visit: Payer: Self-pay

## 2021-05-30 DIAGNOSIS — M79671 Pain in right foot: Secondary | ICD-10-CM

## 2021-05-30 DIAGNOSIS — M79672 Pain in left foot: Secondary | ICD-10-CM | POA: Diagnosis not present

## 2021-05-30 DIAGNOSIS — Q828 Other specified congenital malformations of skin: Secondary | ICD-10-CM

## 2021-05-30 DIAGNOSIS — M79609 Pain in unspecified limb: Secondary | ICD-10-CM | POA: Diagnosis not present

## 2021-05-30 DIAGNOSIS — B351 Tinea unguium: Secondary | ICD-10-CM | POA: Diagnosis not present

## 2021-05-30 DIAGNOSIS — L84 Corns and callosities: Secondary | ICD-10-CM

## 2021-05-30 NOTE — Progress Notes (Signed)
  Subjective:  Patient ID: Justin Lester, male    DOB: October 20, 1930,  MRN: [id number]  [name] presents to clinic today for painful porokeratotic lesion(s) of both feet and painful mycotic toenails that limit ambulation. Painful toenails interfere with ambulation. Aggravating factors include wearing enclosed shoe gear. Pain is relieved with periodic professional debridement. Painful porokeratotic lesions are aggravated when weightbearing with and without shoegear. Pain is relieved with periodic professional debridement.  Patient has h/o Alzheimer's. He is accompanied by his wife who is his primary caregiver. She states his nails are very long and wishes to come in sooner for [name]'s nail care.  PCP is [name], MD , and last visit was 11/26/2020.  No Known Allergies  Review of Systems: Negative except as noted in the HPI. Objective:   Constitutional [name] is a pleasant 87 y.o. Caucasian male, WD, WN in NAD. AAO x 3.   Vascular Capillary refill time to digits immediate b/l lower extremities. Palpable DP pulse(s) b/l lower extremities Palpable PT pulse(s) b/l lower extremities Pedal hair absent. Lower extremity skin temperature gradient within normal limits. No pain with calf compression b/l. No edema noted b/l LE. No cyanosis or clubbing noted.  Neurologic Normal speech. Oriented to person, place, and time. Protective sensation intact 5/5 intact bilaterally with 10g monofilament b/l. Vibratory sensation intact b/l.  Dermatologic Pedal skin is warm and supple b/l LE. No open wounds b/l LE. No interdigital macerations noted b/l LE. Toenails 1-5 b/l elongated, discolored, dystrophic, thickened, crumbly with subungual debris and tenderness to dorsal palpation. Hyperkeratotic lesion(s) L hallux and L 2nd toe.  No erythema, no edema, no drainage, no fluctuance. Porokeratotic lesion(s) submet head 5 right foot. No erythema, no edema, no drainage, no fluctuance.  Orthopedic: Normal  muscle strength 5/5 to all lower extremity muscle groups bilaterally. Hallux valgus with bunion deformity noted b/l lower extremities. Hammertoe(s) noted to the L 2nd toe. Plantar fat pad atrophy of forefoot area b/l lower extremities.   Radiographs: None Assessment:   1. Pain due to onychomycosis of nail   2. Corns   3. Porokeratosis   4. Pain in both feet    Plan:  Patient was evaluated and treated and all questions answered. Consent given for treatment as described below: -Examined patient. -Patient to continue soft, supportive shoe gear daily. -Toenails 1-5 b/l were debrided in length and girth with sterile nail nippers and dremel without iatrogenic bleeding.  -Corn(s) L hallux and L 2nd toe pared utilizing sterile scalpel blade without complication or incident. Total number debrided=2. -Painful porokeratotic lesion(s) submet head 5 right foot pared and enucleated with sterile scalpel blade without incident. Total number of lesions debrided=1. -Patient to report any pedal injuries to medical professional immediately. -Patient/POA to call should there be question/concern in the interim.  Return in about 10 weeks (around 08/08/2021).  Marzetta Board, DPM

## 2021-06-07 DIAGNOSIS — G301 Alzheimer's disease with late onset: Secondary | ICD-10-CM | POA: Diagnosis not present

## 2021-06-07 DIAGNOSIS — R4701 Aphasia: Secondary | ICD-10-CM | POA: Diagnosis not present

## 2021-06-07 DIAGNOSIS — G9389 Other specified disorders of brain: Secondary | ICD-10-CM | POA: Diagnosis not present

## 2021-06-24 DIAGNOSIS — L22 Diaper dermatitis: Secondary | ICD-10-CM | POA: Diagnosis not present

## 2021-06-24 DIAGNOSIS — R609 Edema, unspecified: Secondary | ICD-10-CM | POA: Diagnosis not present

## 2021-06-24 DIAGNOSIS — B372 Candidiasis of skin and nail: Secondary | ICD-10-CM | POA: Diagnosis not present

## 2021-06-24 DIAGNOSIS — S80811S Abrasion, right lower leg, sequela: Secondary | ICD-10-CM | POA: Diagnosis not present

## 2021-08-08 ENCOUNTER — Other Ambulatory Visit: Payer: Self-pay

## 2021-08-08 ENCOUNTER — Ambulatory Visit: Payer: Medicare Other | Admitting: Podiatry

## 2021-08-08 ENCOUNTER — Encounter: Payer: Self-pay | Admitting: Podiatry

## 2021-08-08 DIAGNOSIS — M79609 Pain in unspecified limb: Secondary | ICD-10-CM | POA: Diagnosis not present

## 2021-08-08 DIAGNOSIS — B351 Tinea unguium: Secondary | ICD-10-CM | POA: Diagnosis not present

## 2021-08-08 DIAGNOSIS — M79672 Pain in left foot: Secondary | ICD-10-CM

## 2021-08-08 DIAGNOSIS — L84 Corns and callosities: Secondary | ICD-10-CM

## 2021-08-08 DIAGNOSIS — M79671 Pain in right foot: Secondary | ICD-10-CM | POA: Diagnosis not present

## 2021-08-08 DIAGNOSIS — Q828 Other specified congenital malformations of skin: Secondary | ICD-10-CM | POA: Diagnosis not present

## 2021-08-12 NOTE — Progress Notes (Signed)
°  Subjective:  Patient ID: Justin Lester, male    DOB: 05-01-1931,  MRN: [id number]  88 y.o. male presents corn(s) left foot and painful thick toenails that are difficult to trim. Painful toenails interfere with ambulation. Aggravating factors include wearing enclosed shoe gear. Pain is relieved with periodic professional debridement. Painful corns are aggravated when weightbearing when wearing enclosed shoe gear. Pain is relieved with periodic professional debridement. and painful porokeratotic lesions bilaterally.  Pain prevent comfortable ambulation. Aggravating factor is weightbearing with or without shoegear.  Patient is accompanied by his wife on today's visit.   PCP is [name], MD , and last visit was 03/08/2021.  No Known Allergies  Review of Systems: Negative except as noted in the HPI.   Objective:  Vascular Examination: Vascular status intact b/l with palpable pedal pulses. CFT immediate b/l. No edema. No pain with calf compression b/l. Skin temperature gradient WNL b/l. No edema noted b/l LE.  Neurological Examination: Protective sensation intact 5/5 intact bilaterally with 10g monofilament b/l. Vibratory sensation intact b/l.  Dermatological Examination: Pedal skin with normal turgor, texture and tone b/l. Toenails 1-5 b/l thick, discolored, elongated with subungual debris and pain on dorsal palpation. Hyperkeratotic lesion(s) submet head 2 left foot.  No erythema, no edema, no drainage, no fluctuance. Porokeratotic lesion(s) submet head 5 right foot. No erythema, no edema, no drainage, no fluctuance.  Musculoskeletal Examination: Normal muscle strength 5/5 to all lower extremity muscle groups bilaterally. HAV with bunion deformity noted b/l LE. Hammertoe(s) noted to the L 2nd toe. Plantar fat pad atrophy of forefoot area b/l lower extremities.. No pain, crepitus or joint limitation noted with ROM b/l LE.  Patient ambulates independently without assistive  aids.  Radiographs: None Assessment:   1. Pain due to onychomycosis of nail   2. Porokeratosis   3. Callus   4. Pain in both feet    Plan:  -Interdigital corns left hallux and left 2nd digit have resolved. He continues to wear toe spacer daily per wife. -Mycotic toenails 1-5 bilaterally were debrided in length and girth with sterile nail nippers and dremel without incident. -Callus(es) submet head 2 left foot pared utilizing sterile scalpel blade without complication or incident. Total number debrided =1. -Painful porokeratotic lesion(s) submet head 5 right foot pared and enucleated with sterile scalpel blade without incident. Total number of lesions debrided=1. -Patient/POA to call should there be question/concern in the interim.  Return in about 3 months (around 11/06/2021).  Marzetta Board, DPM

## 2021-08-22 ENCOUNTER — Telehealth: Payer: Self-pay | Admitting: Neurology

## 2021-08-22 ENCOUNTER — Other Ambulatory Visit: Payer: Self-pay | Admitting: Neurology

## 2021-08-22 MED ORDER — MEMANTINE HCL-DONEPEZIL HCL ER 28-10 MG PO CP24: 1.0000 | ORAL_CAPSULE | Freq: Every day | ORAL | 1 refills | Status: DC

## 2021-08-22 NOTE — Telephone Encounter (Signed)
Refill has been sent in for the patient

## 2021-08-22 NOTE — Telephone Encounter (Signed)
Pt is requesting a refill for Memantine HCl-Donepezil HCl 28-10 MG CP24.  Pharmacy: [facility]

## 2021-09-04 NOTE — Telephone Encounter (Addendum)
Called [facility] mail order and spoke w/ Threasa Beards. Confirmed cost of drug. She transferred me to another department. Spoke w/ Liane. She sees reverse claim from 08/22/21. Copay 131.00. Trial claim now showing 47.00. Phone # [phone].  I called wife and relayed above info. She verbalized understanding and appreciation. She will call mail order pharmacy to process refill. She will call back if she has any further issues.

## 2021-09-04 NOTE — Telephone Encounter (Signed)
Pt's wife has called to report that she was contacted by her local [facility] re: the medication :Memantine HCl-Donepezil HCl 28-10 MG CP24. She was told by them it would be $2300.00 pt's wife said  she told them to cancel the order.  She only wants it thru [facility].  Wife was told that was the pharmacy mentioned to RN in request for a refill.  Please call pt's wife

## 2021-09-05 DIAGNOSIS — D0462 Carcinoma in situ of skin of left upper limb, including shoulder: Secondary | ICD-10-CM | POA: Diagnosis not present

## 2021-09-09 DIAGNOSIS — I6523 Occlusion and stenosis of bilateral carotid arteries: Secondary | ICD-10-CM | POA: Diagnosis not present

## 2021-09-09 DIAGNOSIS — Z87891 Personal history of nicotine dependence: Secondary | ICD-10-CM | POA: Diagnosis not present

## 2021-09-09 DIAGNOSIS — I714 Abdominal aortic aneurysm, without rupture, unspecified: Secondary | ICD-10-CM | POA: Diagnosis not present

## 2021-09-09 DIAGNOSIS — I10 Essential (primary) hypertension: Secondary | ICD-10-CM | POA: Diagnosis not present

## 2021-09-09 DIAGNOSIS — Z8679 Personal history of other diseases of the circulatory system: Secondary | ICD-10-CM | POA: Diagnosis not present

## 2021-09-09 DIAGNOSIS — Z9889 Other specified postprocedural states: Secondary | ICD-10-CM | POA: Diagnosis not present

## 2021-09-09 DIAGNOSIS — E785 Hyperlipidemia, unspecified: Secondary | ICD-10-CM | POA: Diagnosis not present

## 2021-09-09 DIAGNOSIS — N183 Chronic kidney disease, stage 3 unspecified: Secondary | ICD-10-CM | POA: Diagnosis not present

## 2021-09-09 DIAGNOSIS — I129 Hypertensive chronic kidney disease with stage 1 through stage 4 chronic kidney disease, or unspecified chronic kidney disease: Secondary | ICD-10-CM | POA: Diagnosis not present

## 2021-09-09 DIAGNOSIS — Z95828 Presence of other vascular implants and grafts: Secondary | ICD-10-CM | POA: Diagnosis not present

## 2021-09-12 ENCOUNTER — Other Ambulatory Visit: Payer: Self-pay | Admitting: Cardiology

## 2021-10-02 DIAGNOSIS — R262 Difficulty in walking, not elsewhere classified: Secondary | ICD-10-CM | POA: Diagnosis not present

## 2021-10-02 DIAGNOSIS — R8290 Unspecified abnormal findings in urine: Secondary | ICD-10-CM | POA: Diagnosis not present

## 2021-10-02 DIAGNOSIS — G309 Alzheimer's disease, unspecified: Secondary | ICD-10-CM | POA: Diagnosis not present

## 2021-10-02 DIAGNOSIS — F02818 Dementia in other diseases classified elsewhere, unspecified severity, with other behavioral disturbance: Secondary | ICD-10-CM | POA: Diagnosis not present

## 2021-10-21 DIAGNOSIS — L22 Diaper dermatitis: Secondary | ICD-10-CM | POA: Diagnosis not present

## 2021-10-21 DIAGNOSIS — B372 Candidiasis of skin and nail: Secondary | ICD-10-CM | POA: Diagnosis not present

## 2021-10-21 DIAGNOSIS — R609 Edema, unspecified: Secondary | ICD-10-CM | POA: Diagnosis not present

## 2021-11-14 ENCOUNTER — Encounter: Payer: Self-pay | Admitting: Neurology

## 2021-11-14 ENCOUNTER — Ambulatory Visit: Payer: Medicare Other | Admitting: Neurology

## 2021-11-14 VITALS — BP 119/58 | HR 70 | Ht 67.0 in | Wt 172.0 lb

## 2021-11-14 DIAGNOSIS — F02C11 Dementia in other diseases classified elsewhere, severe, with agitation: Secondary | ICD-10-CM

## 2021-11-14 DIAGNOSIS — R4701 Aphasia: Secondary | ICD-10-CM

## 2021-11-14 DIAGNOSIS — R32 Unspecified urinary incontinence: Secondary | ICD-10-CM | POA: Diagnosis not present

## 2021-11-14 DIAGNOSIS — G301 Alzheimer's disease with late onset: Secondary | ICD-10-CM

## 2021-11-14 NOTE — Progress Notes (Signed)
? ?[facility] ? ?PATIENT: [name] ?DOB: 1931-03-27 ? ?REFERRING DOCTOR OR PCP: Shirlee More, MD (cardiology); Dr. Delena Bali MD (PCP) ?SOURCE: Patient, wife, notes from Dr. Verdene Rio (now retired), ? ?_________________________________ ? ? ?HISTORICAL ? ?CHIEF COMPLAINT:  ?Chief Complaint  ?Patient presents with  ? Follow-up  ?  Rm 1 with wife here for 6 month f/u- last mmse ws 3/30- pt's wife reports sx have progressed. Feels like he has trouble with understanding commands. Report incontinence   ? ? ?HISTORY OF PRESENT ILLNESS:  ?Justin Lester is a 88 y.o. man with dementia. ? ?Update 11/14/2021 ?He continues to progress and now does not always remember his wife.    He doesn't understand questions asked to him and doesn't always respond to questions.   He does not like to make any choices.     He has had more incontinence and reuires Depends.   He is able to do simple tasks such as self-feeding but cannot manage more complex tasks without assistance or reminders ? ?He is able to walk and his wife takes him for a 1/4 walk once or twice a day and gets hi to use an exercise bike some.    ? ?He does not take naps during the day since his wife tries to keep him active.    He goes to [employer] three times a week for 5 hours ? ? ?From initial consult 05/16/2021 ?He is an 88 year old man who was diagnosed with AD in 2017. 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    He slowly worsened and stopped driving 2 years ago.    ? ?In May 2022, he had more acute changes in cognition and behavior with more difficulty with speech and doing instrumental ADLs.   He i started to have hallucinations..   He sees men who are in the house and outside.   He is not afraid of them and they are nt causing problems. .  He has 'run away' a few times -- walking away in the middle of the night.    ? ?He had initially seen Dr. Verdene Rio.  He was initially on donepezil and then memantine was  added.  He is now on memantine/donepezil 28/10 daily.   When Dr. Verdene Rio retired, he briefly saw Dr. Kris Mouton but she has moved.  Dr. Bettina Gavia, his cardiologist is referring hm.    ? ?He has word finding difficulty and has trouble saying more complicated thoughts.  He has movements where he seems to be working in the middle of the night.   He sometimes kicks in the night but never gets out of the bed or attacks.   He has more behavior issues, especially later in the day.    He requires constant supervision and his wife is able to distract him if he gets a little agitated.  Risperdal has helped the agitation.    ? ?He has become incontinent.  Initially had dripping now requires Depends.   He is physically active and spends most of the day following his wife around and helping her with the household chores and some gardening.    ? ?He falls asleep easily and sleeps 7 hours and does not nap. ? ?He had a skin cancer removed on the left scalp and has a large scar.    His wife notes that his memory problems seemed to start after that.    ? ?MRI report 10/18/2015 was personally reviewed  and showed atrophy that was most pronounced in the medial temporal lobes.  There is mild chronic microvascular ischemic change.  There were no acute findings.   ? ? ?  05/16/2021  ? 10:51 AM  ?MMSE - Mini Mental State Exam  ?Orientation to time 0  ?Orientation to Place 0  ?Registration 3  ?Attention/ Calculation 0  ?Recall 0  ?Language- name 2 objects 0  ?Language- repeat 0  ?Language- follow 3 step command 0  ?Language- read & follow direction 0  ?Write a sentence 0  ?Copy design 0  ?Total score 3  ?  ?Vitamin B12 was normal on 07/16/2020.  Comprehensive metabolic panel 1/93/7902 was unremarkable. ? ?REVIEW OF SYSTEMS: ?Constitutional: No fevers, chills, sweats, or change in appetite ?Eyes: No visual changes, double vision, eye pain ?Ear, nose and throat: No hearing loss, ear pain, nasal congestion, sore throat ?Cardiovascular: No chest pain,  palpitations ?Respiratory:  No shortness of breath at rest or with exertion.   No wheezes ?GastrointestinaI: No nausea, vomiting, diarrhea, abdominal pain, fecal incontinence ?Genitourinary:  No dysuria, urinary retention or frequency.  No nocturia. ?Musculoskeletal:  No neck pain, back pain ?Integumentary: No rash, pruritus, skin lesions ?Neurological: as above ?Psychiatric: No depression at this time.  No anxiety ?Endocrine: No palpitations, diaphoresis, change in appetite, change in weigh or increased thirst ?Hematologic/Lymphatic:  No anemia, purpura, petechiae. ?Allergic/Immunologic: No itchy/runny eyes, nasal congestion, recent allergic reactions, rashes ? ?ALLERGIES: ?No Known Allergies ? ?HOME MEDICATIONS: ? ?Current Outpatient Medications:  ?  acetaminophen (TYLENOL) 325 MG tablet, Take 2 tablets (650 mg total) by mouth every 6 (six) hours as needed for mild pain (or Fever >/= 101)., Disp: 30 tablet, Rfl: 1 ?  aspirin EC 81 MG tablet, Take 81 mg by mouth as directed. Take on MWF, Disp: , Rfl:  ?  atorvastatin (LIPITOR) 10 MG tablet, TAKE 1 TABLET BY MOUTH  DAILY, Disp: 90 tablet, Rfl: 3 ?  Cholecalciferol (VITAMIN D PO), Take 5,000 Units by mouth daily. Vit D3, Disp: , Rfl:  ?  diclofenac Sodium (VOLTAREN) 1 % GEL, Apply 2 g topically 4 (four) times daily as needed (pain)., Disp: 100 g, Rfl: 1 ?  levOCARNitine (CARNITINE, L,) POWD, 500 mg by Does not apply route daily., Disp: , Rfl:  ?  magnesium oxide (MAG-OX) 400 MG tablet, Take 400 mg by mouth daily., Disp: , Rfl:  ?  Memantine HCl-Donepezil HCl 28-10 MG CP24, Take 1 capsule by mouth daily., Disp: 90 capsule, Rfl: 1 ?  Multiple Vitamins-Minerals (MULTI ADULT GUMMIES PO), Take 2 each by mouth daily., Disp: , Rfl:  ?  pramipexole (MIRAPEX) 1 MG tablet, Take 1 mg by mouth at bedtime., Disp: , Rfl:  ?  ramipril (ALTACE) 2.5 MG capsule, TAKE 1 CAPSULE BY MOUTH  DAILY, Disp: 90 capsule, Rfl: 3 ?  risperiDONE (RISPERDAL) 0.5 MG tablet, One po qAM and 2 po qPM,  Disp: 90 tablet, Rfl: 11 ?  traZODone (DESYREL) 50 MG tablet, Take 50 mg by mouth at bedtime. , Disp: , Rfl:  ?  UNABLE TO FIND, Take 10 mg by mouth daily. Med Name: Apotex, Disp: , Rfl:  ?  VITAMIN E PO, Take 2 each by mouth daily. Gummy, Disp: , Rfl:  ?  Zinc 50 MG CAPS, Take 50 mg by mouth daily., Disp: , Rfl:  ? ?PAST MEDICAL HISTORY: ?Past Medical History:  ?Diagnosis Date  ? Abdominal aortic aneurysm without rupture 07/14/2016  ? Overview:  s/p EVAR 06/2015  ?  Alzheimer's dementia with behavioral disturbance ([place]) 03/07/2020  ? Bilateral carotid artery stenosis 07/14/2016  ? Caregiver stress 04/11/2020  ? Continuous leakage of urine 06/10/2019  ? Coronary artery disease involving native coronary artery of native heart with angina pectoris ([place]) 04/26/2015  ? Overview:  CABG was done April 2006 at [facility], LTA to LAD and 2 SVG's to D and [facility] Treadmill stress EKG negative for ischemia at 11 Mets Oct 2016  ? Dementia ([place])   ? Esophageal reflux   ? Essential hypertension 04/26/2015  ? H/O aortic aneurysm repair 08/05/2018  ? Hyperlipidemia 04/26/2015  ? Metatarsalgia of both feet 11/24/2018  ? Old myocardial infarct   ? Pre-ulcerative calluses 11/24/2018  ? Prostate cancer ([place])   ? RBBB 04/26/2015  ? Ruptured abdominal aortic aneurysm 04/26/2015  ? Overview:  NOT RUPTURED YET 55 by 55 mm size  ? SDAT (senile dementia of Alzheimer's type) ([place]) 06/10/2019  ? Streptococcal bacteremia 03/25/2020  ? Toenail fungus 11/24/2018  ? Urinary incontinence   ? ? ?PAST SURGICAL HISTORY: ?Past Surgical History:  ?Procedure Laterality Date  ? ABDOMINAL AORTA STENT    ? CORONARY ARTERY BYPASS GRAFT    ? HERNIA REPAIR    ? PROSTATE SURGERY    ? ? ?FAMILY HISTORY: ?Family History  ?Problem Relation Age of Onset  ? CAD Mother   ? CAD Sister   ? CAD Brother   ? ? ?SOCIAL HISTORY: ? ?Social History  ? ?Socioeconomic History  ? Marital status: Married  ?  Spouse name: Not on file  ? Number of children: 2  ? Years of education: college  ? Highest  education level: Not on file  ?Occupational History  ? Not on file  ?Tobacco Use  ? Smoking status: Former  ? Smokeless tobacco: Never  ?Vaping Use  ? Vaping Use: Never used  ?Substance and Sexual Activity

## 2021-11-21 ENCOUNTER — Ambulatory Visit: Payer: Medicare Other | Admitting: Podiatry

## 2021-11-21 ENCOUNTER — Encounter: Payer: Self-pay | Admitting: Podiatry

## 2021-11-21 ENCOUNTER — Other Ambulatory Visit: Payer: Self-pay | Admitting: *Deleted

## 2021-11-21 DIAGNOSIS — M79674 Pain in right toe(s): Secondary | ICD-10-CM | POA: Diagnosis not present

## 2021-11-21 DIAGNOSIS — Q828 Other specified congenital malformations of skin: Secondary | ICD-10-CM

## 2021-11-21 DIAGNOSIS — L84 Corns and callosities: Secondary | ICD-10-CM | POA: Diagnosis not present

## 2021-11-21 DIAGNOSIS — B351 Tinea unguium: Secondary | ICD-10-CM | POA: Diagnosis not present

## 2021-11-21 DIAGNOSIS — M79671 Pain in right foot: Secondary | ICD-10-CM | POA: Diagnosis not present

## 2021-11-21 DIAGNOSIS — M79675 Pain in left toe(s): Secondary | ICD-10-CM | POA: Diagnosis not present

## 2021-11-21 DIAGNOSIS — M79672 Pain in left foot: Secondary | ICD-10-CM

## 2021-12-01 NOTE — Progress Notes (Signed)
?  Subjective:  ?Patient ID: Justin Lester, male    DOB: 1931-07-15,  MRN: [id number] ? ?Justin Lester presents to clinic today for painful porokeratotic lesion(s) b/l lower extremities and painful mycotic toenails that limit ambulation. Painful toenails interfere with ambulation. Aggravating factors include wearing enclosed shoe gear. Pain is relieved with periodic professional debridement. Painful porokeratotic lesions are aggravated when weightbearing with and without shoegear. Pain is relieved with periodic professional debridement. ? ?Patient has h/o dementia and is accompanied by his wife on today's visit.  ? ?New problem(s): None.  ? ?PCP is [name], MD , and last visit was April 04, 2021. ? ?No Known Allergies ? ?Review of Systems: Negative except as noted in the HPI. ? ?Objective: No changes noted in today's physical examination. ?Justin Lester is a pleasant 88 y.o. male, WD, WN in NAD. AAO x 2. ? ?Vascular Examination: ?Vascular status intact b/l with palpable pedal pulses. CFT immediate b/l. No edema. No pain with calf compression b/l. Skin temperature gradient WNL b/l. No edema noted b/l LE. ? ?Neurological Examination: ?Protective sensation intact 5/5 intact bilaterally with 10g monofilament b/l. Vibratory sensation intact b/l. ? ?Dermatological Examination: ?Pedal skin with normal turgor, texture and tone b/l. Toenails 1-5 b/l thick, discolored, elongated with subungual debris and pain on dorsal palpation. Hyperkeratotic lesion(s) submet head 2 left foot.  No erythema, no edema, no drainage, no fluctuance. Porokeratotic lesion(s) submet head 5 right foot. No erythema, no edema, no drainage, no fluctuance. ? ?Musculoskeletal Examination: ?Normal muscle strength 5/5 to all lower extremity muscle groups bilaterally. HAV with bunion deformity noted b/l LE. Hammertoe(s) noted to the L 2nd toe. Plantar fat pad atrophy of forefoot area b/l lower extremities.. No pain, crepitus or joint limitation  noted with ROM b/l LE.  Patient ambulates independently without assistive aids. ? ?Radiographs: None ?Assessment/Plan: ?1. Porokeratosis   ?2. Pain due to onychomycosis of nail   ?3. Callus   ?4. Pain in both feet   ?  ?-Patient was evaluated and treated. All patient's and/or POA's questions/concerns answered on today's visit. ?-Medicare ABN signed. Patient consents for services of paring of corn(s)/callus(es)/porokeratos(es) today. Copy in patient chart. ?-Toenails 1-5 b/l were debrided in length and girth with sterile nail nippers and dremel without iatrogenic bleeding.  ?-Callus(es) submet head 2 left foot pared utilizing sterile scalpel blade without complication or incident. Total number debrided =1. ?-Painful porokeratotic lesion(s) submet head 5 right foot pared and enucleated with sterile scalpel blade without incident. Total number of lesions debrided=1. ?-Patient/POA to call should there be question/concern in the interim.  ? ?Return in about 3 months (around 02/20/2022). ? ?Marzetta Board, DPM  ?

## 2021-12-16 IMAGING — MR MR THORACIC SPINE WO/W CM
15 of 18 series · 35 of 48 positions shown · IV contrast (gadavist)
Comparison: None.

CLINICAL DATA: Bacteremia, back pain

EXAM:
MRI THORACIC AND LUMBAR SPINE WITHOUT AND WITH CONTRAST
TECHNIQUE: Multiplanar and multiecho pulse sequences of the thoracic and lumbar
spine were obtained without and with intravenous contrast.
CONTRAST:  7mL GADAVIST GADOBUTROL 1 MMOL/ML IV SOLN

[Series 35: T1 · sagittal · 4.0mm · 1.72mm/px · 2 of 13 slices shown (1 of 6)]
[im 1/13]
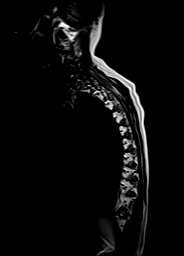
[im 13/13]
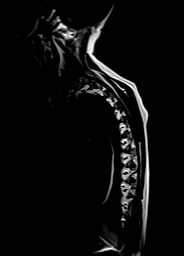

[Series 36: STIR · sagittal · 3.0mm · 1.00mm/px · 2 of 17 slices shown (1 of 2)]
[im 1/17]
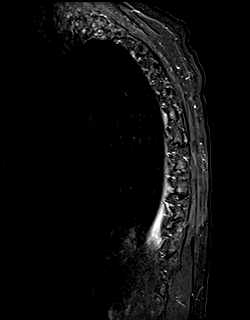
[im 17/17]
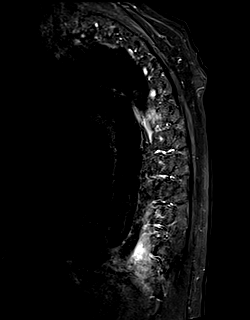

[Series 37: T1 · sagittal · 3.0mm · 1.00mm/px · 2 of 17 slices shown (2 of 6)]
[im 1/17]
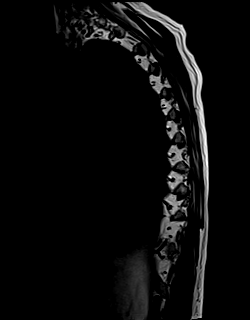
[im 17/17]
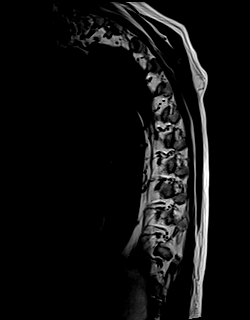

[Series 38: T2 · sagittal · 3.0mm · 0.83mm/px · 2 of 17 slices shown (1 of 4)]
[im 1/17]
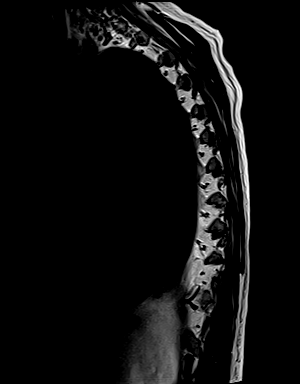
[im 17/17]
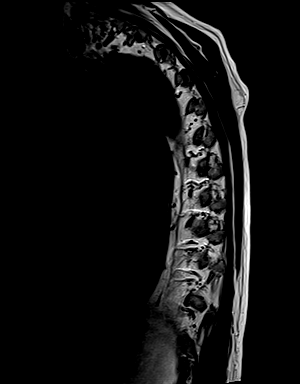

[Series 39: T2 · axial · 4.0mm · 0.78mm/px · z∈[-7,+184]mm · 4 of 36 slices shown (2 of 4)]
[im 1/36]
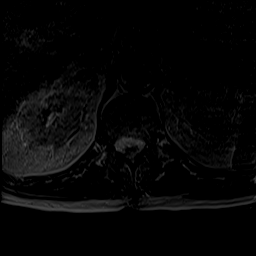
[im 12/36]
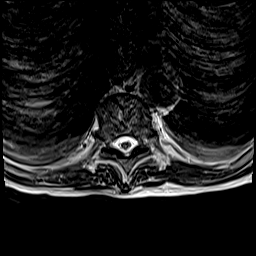
[im 24/36]
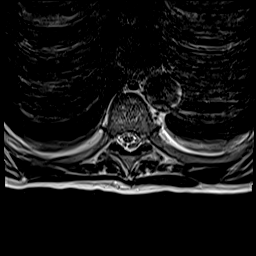
[im 36/36]
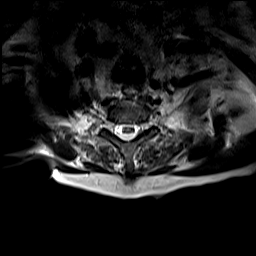

[Series 41: T1 · axial · 4.0mm · 0.39mm/px · z∈[-7,+184]mm · 4 of 36 slices shown (3 of 6)]
[im 1/36]
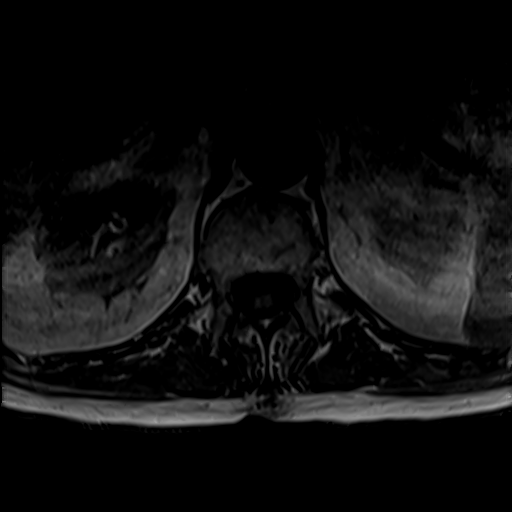
[im 12/36]
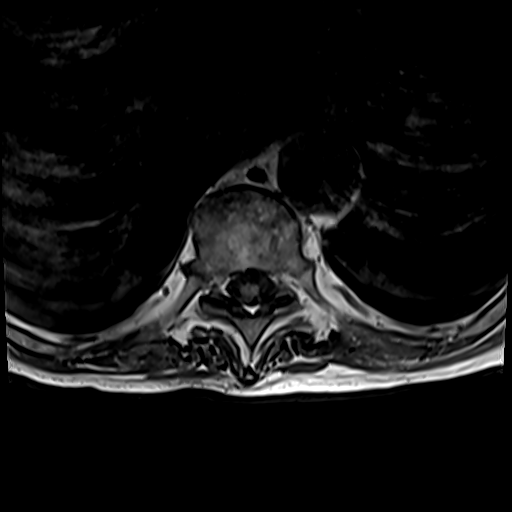
[im 24/36]
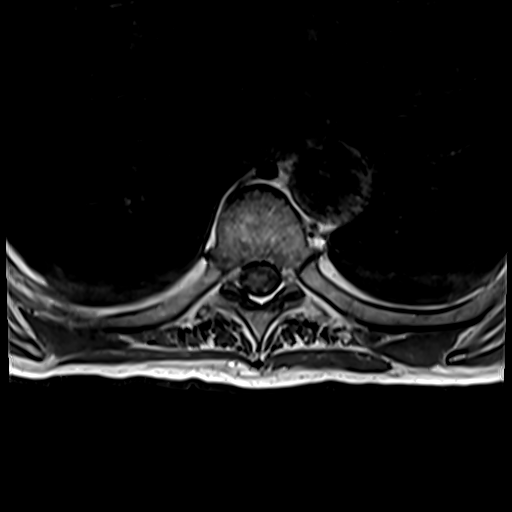
[im 36/36]
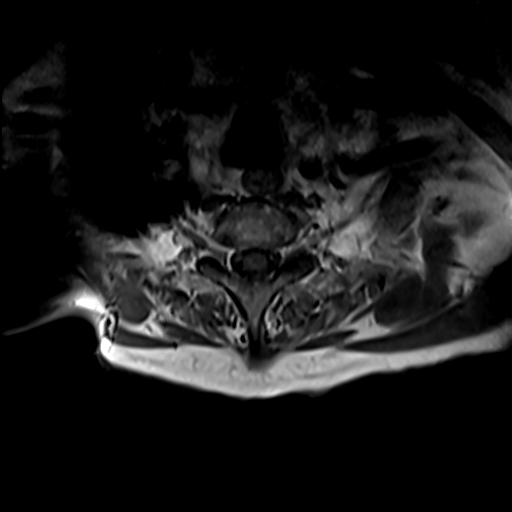

[Series 42: T1 · sagittal · 4.0mm · 0.81mm/px · 2 of 17 slices shown (4 of 6)]
[im 1/17]
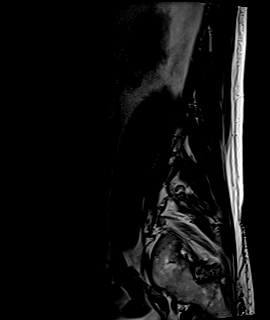
[im 17/17]
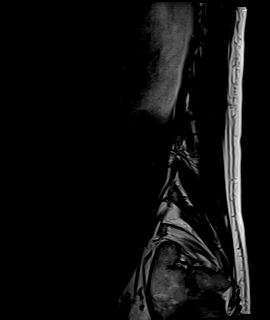

[Series 43: T2 · sagittal · 4.0mm · 0.81mm/px · 2 of 17 slices shown (3 of 4)]
[im 1/17]
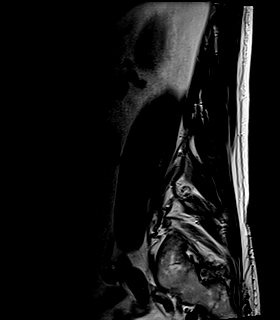
[im 17/17]
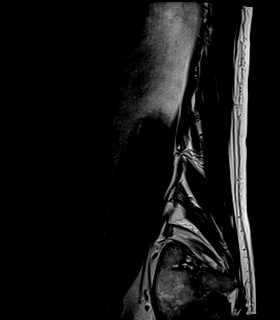

[Series 44: STIR · sagittal · 4.0mm · 0.51mm/px · 2 of 17 slices shown (2 of 2)]
[im 1/17]
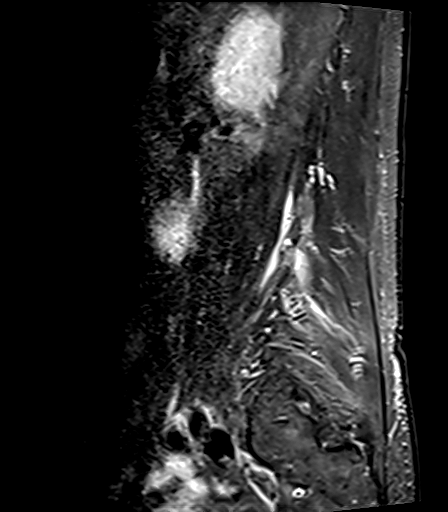
[im 17/17]
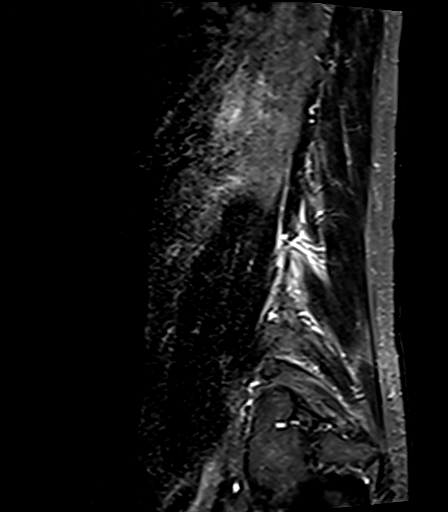

[Series 45: T2 · axial · 4.0mm · 0.62mm/px · z∈[-238,-39]mm · 2 of 18 slices shown (4 of 4)]
[im 1/18]
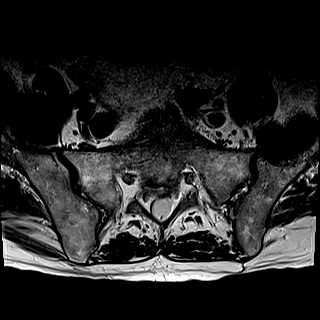
[im 18/18]
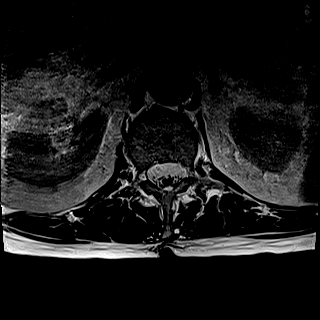

[Series 46: T1 · axial · 4.0mm · 0.39mm/px · z∈[-243,-39]mm · 4 of 36 slices shown (5 of 6)]
[im 1/36]
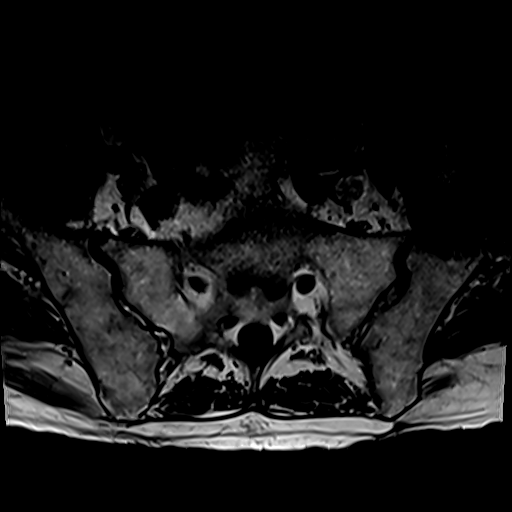
[im 12/36]
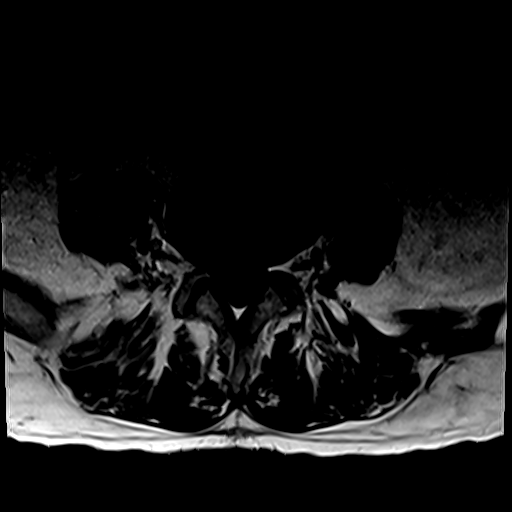
[im 24/36]
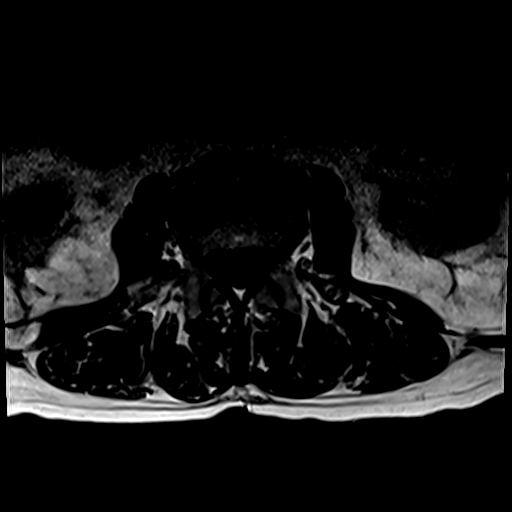
[im 36/36]
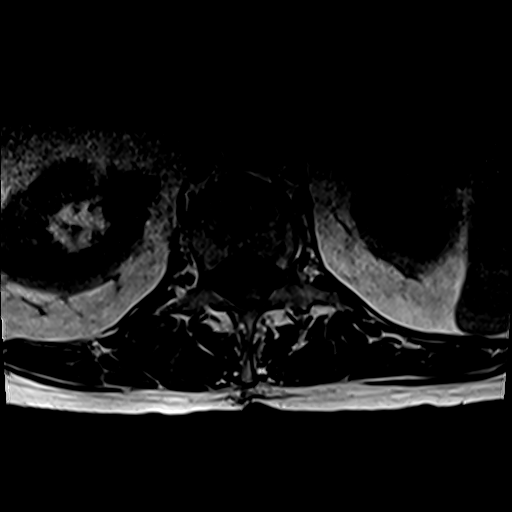

[Series 87: T1 · sagittal · 4.0mm · 1.72mm/px · 2 of 13 slices shown (6 of 6)]
[im 1/13]
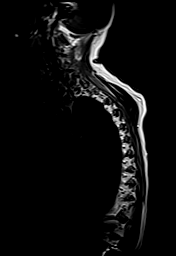
[im 13/13]
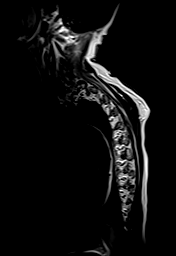

[Series 88: T1 fat-sat post-contrast · sagittal · 3.0mm · 1.00mm/px · 2 of 17 slices shown (1 of 2)]
[im 1/17]
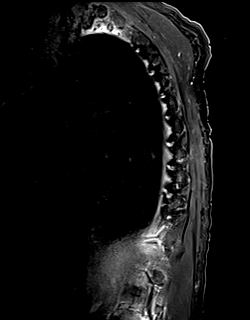
[im 17/17]
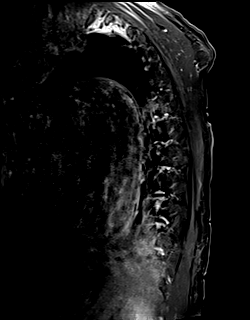

[Series 89: T1 post-contrast · axial · 4.0mm · 0.39mm/px · 1 of 34 slices shown]
[im 1/34]
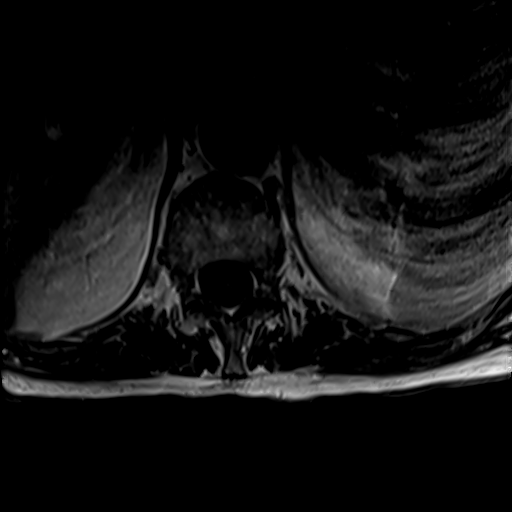

[Series 90: T1 fat-sat post-contrast · sagittal · 4.0mm · 0.81mm/px · 2 of 17 slices shown (2 of 2)]
[im 1/17]
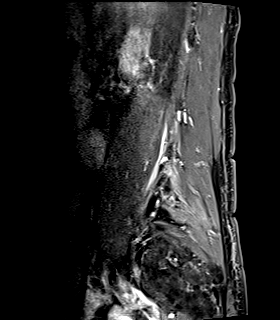
[im 17/17]
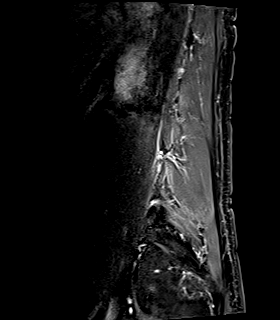

[35 of 48 positions shown; findings below may reference images not displayed]

FINDINGS: MRI THORACIC SPINE

Motion artifact is present particularly on axial imaging.

Alignment: Anteroposterior alignment is maintained.

Vertebrae: No acute compression deformity. There is no marrow edema.
No suspicious osseous lesion.

Cord: No abnormal signal within the above limitation. No abnormal
intrathecal enhancement. No epidural collection.

Paraspinal and other soft tissues: Unremarkable.

Disc levels: Partial fusion across the T10-T11 disc space. No
significant disc herniation. No significant canal or foraminal
stenosis at any level.

MRI LUMBAR SPINE

Segmentation:  Standard.

Alignment:  Preserved.

Vertebrae: No acute compression deformity. No significant marrow
edema. No suspicious osseous lesion.

Conus medullaris: Extends to the L1-L2 level and appears normal. No
abnormal intrathecal enhancement. No epidural collection.

Paraspinal and other soft tissues: Unremarkable.

Disc levels: Punctate central annular fissure at L4-L5. Minor disc
bulge with punctate central annular fissure at L5-S1. Facet
arthropathy at L5-S1. No significant canal or foraminal stenosis at
any level.
IMPRESSION: No evidence of discitis or osteomyelitis. Minor degenerative
changes.

## 2021-12-19 DIAGNOSIS — F02811 Dementia in other diseases classified elsewhere, unspecified severity, with agitation: Secondary | ICD-10-CM | POA: Diagnosis not present

## 2021-12-19 DIAGNOSIS — I10 Essential (primary) hypertension: Secondary | ICD-10-CM | POA: Diagnosis not present

## 2021-12-19 DIAGNOSIS — I2510 Atherosclerotic heart disease of native coronary artery without angina pectoris: Secondary | ICD-10-CM | POA: Diagnosis not present

## 2021-12-19 DIAGNOSIS — G301 Alzheimer's disease with late onset: Secondary | ICD-10-CM | POA: Diagnosis not present

## 2022-01-01 DIAGNOSIS — I10 Essential (primary) hypertension: Secondary | ICD-10-CM | POA: Diagnosis not present

## 2022-01-01 DIAGNOSIS — G301 Alzheimer's disease with late onset: Secondary | ICD-10-CM | POA: Diagnosis not present

## 2022-01-20 DIAGNOSIS — G301 Alzheimer's disease with late onset: Secondary | ICD-10-CM | POA: Diagnosis not present

## 2022-01-20 DIAGNOSIS — I10 Essential (primary) hypertension: Secondary | ICD-10-CM | POA: Diagnosis not present

## 2022-01-23 ENCOUNTER — Other Ambulatory Visit: Payer: Self-pay | Admitting: Neurology

## 2022-02-06 DIAGNOSIS — R609 Edema, unspecified: Secondary | ICD-10-CM | POA: Diagnosis not present

## 2022-02-20 ENCOUNTER — Encounter: Payer: Self-pay | Admitting: Podiatry

## 2022-02-20 ENCOUNTER — Ambulatory Visit: Payer: Medicare Other | Admitting: Podiatry

## 2022-02-20 DIAGNOSIS — L84 Corns and callosities: Secondary | ICD-10-CM | POA: Diagnosis not present

## 2022-02-20 DIAGNOSIS — M79672 Pain in left foot: Secondary | ICD-10-CM

## 2022-02-20 DIAGNOSIS — M79676 Pain in unspecified toe(s): Secondary | ICD-10-CM

## 2022-02-20 DIAGNOSIS — B351 Tinea unguium: Secondary | ICD-10-CM

## 2022-02-20 DIAGNOSIS — M79671 Pain in right foot: Secondary | ICD-10-CM

## 2022-02-20 DIAGNOSIS — Q828 Other specified congenital malformations of skin: Secondary | ICD-10-CM | POA: Diagnosis not present

## 2022-03-01 NOTE — Progress Notes (Signed)
  Subjective:  Patient ID: Justin Lester, male    DOB: 05-22-1931,  MRN: [id number]  Justin Lester presents to clinic today for callus(es) left lower extremity, porokeratotic lesion(s) right lower extremity, and painful mycotic nails. Painful toenails interfere with ambulation. Aggravating factors include wearing enclosed shoe gear. Pain is relieved with periodic professional debridement. Painful callus(es) and porokeratotic lesion(s) are aggravated when weightbearing with and without shoegear. Pain is relieved with periodic professional debridement.  Patient's wife is present during today's visit.  New problem(s): None.   PCP is [name], MD , and last visit was  October 02, 2021  No Known Allergies  Review of Systems: Negative except as noted in the HPI.  Objective: No changes noted in today's physical examination.  Justin Lester is a pleasant 88 y.o. male, WD, WN in NAD. AAO x 2.  Vascular Examination: Vascular status intact b/l with palpable pedal pulses. CFT immediate b/l. No edema. No pain with calf compression b/l. Skin temperature gradient WNL b/l. No edema noted b/l LE.  Neurological Examination: Protective sensation intact 5/5 intact bilaterally with 10g monofilament b/l. Vibratory sensation intact b/l.  Dermatological Examination: Pedal skin with normal turgor, texture and tone b/l. Toenails 1-5 b/l thick, discolored, elongated with subungual debris and pain on dorsal palpation. Hyperkeratotic lesion(s) submet head 2 left foot.  No erythema, no edema, no drainage, no fluctuance. Porokeratotic lesion(s) submet head 5 right foot. No erythema, no edema, no drainage, no fluctuance.  Musculoskeletal Examination: Normal muscle strength 5/5 to all lower extremity muscle groups bilaterally. HAV with bunion deformity noted b/l LE. Hammertoe(s) noted to the L 2nd toe. Plantar fat pad atrophy of forefoot area b/l lower extremities.. No pain, crepitus or joint limitation noted with ROM  b/l LE.  Patient ambulates independently without assistive aids. Wearing [employer] loafers today.  Radiographs: None  Assessment/Plan: 1. Pain due to onychomycosis of nail   2. Callus   3. Porokeratosis   4. Pain in both feet      -Examined patient. -Medicare ABN signed for services of paring of corn(s)/callus(es)/porokeratos(es). Copy in patient chart. -Mycotic toenails 1-5 bilaterally were debrided in length and girth with sterile nail nippers and dremel without incident. -Callus(es) submet head 2 left foot pared utilizing sterile scalpel blade without complication or incident. Total number debrided =1. -Porokeratotic lesion(s) submet head 5 right foot pared and enucleated with sterile scalpel blade without incident. Total number of lesions debrided=1. -Patient/POA to call should there be question/concern in the interim.   Return in about 3 months (around 05/23/2022).  Marzetta Board, DPM

## 2022-03-26 DIAGNOSIS — E785 Hyperlipidemia, unspecified: Secondary | ICD-10-CM | POA: Diagnosis not present

## 2022-03-26 DIAGNOSIS — Z6824 Body mass index (BMI) 24.0-24.9, adult: Secondary | ICD-10-CM | POA: Diagnosis not present

## 2022-03-26 DIAGNOSIS — R1310 Dysphagia, unspecified: Secondary | ICD-10-CM | POA: Diagnosis not present

## 2022-03-26 DIAGNOSIS — G309 Alzheimer's disease, unspecified: Secondary | ICD-10-CM | POA: Diagnosis not present

## 2022-03-26 DIAGNOSIS — E559 Vitamin D deficiency, unspecified: Secondary | ICD-10-CM | POA: Diagnosis not present

## 2022-03-26 DIAGNOSIS — I119 Hypertensive heart disease without heart failure: Secondary | ICD-10-CM | POA: Diagnosis not present

## 2022-03-28 DIAGNOSIS — D649 Anemia, unspecified: Secondary | ICD-10-CM | POA: Diagnosis not present

## 2022-03-28 DIAGNOSIS — E039 Hypothyroidism, unspecified: Secondary | ICD-10-CM | POA: Diagnosis not present

## 2022-03-28 DIAGNOSIS — E559 Vitamin D deficiency, unspecified: Secondary | ICD-10-CM | POA: Diagnosis not present

## 2022-03-28 DIAGNOSIS — E119 Type 2 diabetes mellitus without complications: Secondary | ICD-10-CM | POA: Diagnosis not present

## 2022-03-28 DIAGNOSIS — I10 Essential (primary) hypertension: Secondary | ICD-10-CM | POA: Diagnosis not present

## 2022-03-28 DIAGNOSIS — E789 Disorder of lipoprotein metabolism, unspecified: Secondary | ICD-10-CM | POA: Diagnosis not present

## 2022-04-02 DIAGNOSIS — G309 Alzheimer's disease, unspecified: Secondary | ICD-10-CM | POA: Diagnosis not present

## 2022-04-02 DIAGNOSIS — K5900 Constipation, unspecified: Secondary | ICD-10-CM | POA: Diagnosis not present

## 2022-04-17 DIAGNOSIS — L539 Erythematous condition, unspecified: Secondary | ICD-10-CM | POA: Diagnosis not present

## 2022-04-17 DIAGNOSIS — G309 Alzheimer's disease, unspecified: Secondary | ICD-10-CM | POA: Diagnosis not present

## 2022-04-17 DIAGNOSIS — L249 Irritant contact dermatitis, unspecified cause: Secondary | ICD-10-CM | POA: Diagnosis not present

## 2022-05-05 DIAGNOSIS — G309 Alzheimer's disease, unspecified: Secondary | ICD-10-CM | POA: Diagnosis not present

## 2022-05-05 DIAGNOSIS — J309 Allergic rhinitis, unspecified: Secondary | ICD-10-CM | POA: Diagnosis not present

## 2022-05-16 DIAGNOSIS — N390 Urinary tract infection, site not specified: Secondary | ICD-10-CM | POA: Diagnosis not present

## 2022-05-16 DIAGNOSIS — R895 Abnormal microbiological findings in specimens from other organs, systems and tissues: Secondary | ICD-10-CM | POA: Diagnosis not present

## 2022-06-05 ENCOUNTER — Ambulatory Visit: Payer: Medicare Other | Admitting: Podiatry

## 2022-06-20 DIAGNOSIS — L603 Nail dystrophy: Secondary | ICD-10-CM | POA: Diagnosis not present

## 2022-06-20 DIAGNOSIS — B351 Tinea unguium: Secondary | ICD-10-CM | POA: Diagnosis not present

## 2022-07-21 DIAGNOSIS — D485 Neoplasm of uncertain behavior of skin: Secondary | ICD-10-CM | POA: Diagnosis not present

## 2022-08-06 DIAGNOSIS — G309 Alzheimer's disease, unspecified: Secondary | ICD-10-CM

## 2022-08-06 DIAGNOSIS — R600 Localized edema: Secondary | ICD-10-CM

## 2022-08-18 DIAGNOSIS — I119 Hypertensive heart disease without heart failure: Secondary | ICD-10-CM | POA: Diagnosis not present

## 2022-08-18 DIAGNOSIS — G4700 Insomnia, unspecified: Secondary | ICD-10-CM | POA: Diagnosis not present

## 2022-08-18 DIAGNOSIS — R600 Localized edema: Secondary | ICD-10-CM | POA: Diagnosis not present

## 2022-08-18 DIAGNOSIS — E785 Hyperlipidemia, unspecified: Secondary | ICD-10-CM | POA: Diagnosis not present

## 2022-08-18 DIAGNOSIS — G309 Alzheimer's disease, unspecified: Secondary | ICD-10-CM

## 2022-08-29 DIAGNOSIS — C44529 Squamous cell carcinoma of skin of other part of trunk: Secondary | ICD-10-CM | POA: Diagnosis not present

## 2022-09-03 DIAGNOSIS — R4182 Altered mental status, unspecified: Secondary | ICD-10-CM | POA: Diagnosis not present

## 2022-09-03 DIAGNOSIS — N390 Urinary tract infection, site not specified: Secondary | ICD-10-CM | POA: Diagnosis not present

## 2022-09-03 DIAGNOSIS — G309 Alzheimer's disease, unspecified: Secondary | ICD-10-CM | POA: Diagnosis not present

## 2022-09-03 DIAGNOSIS — R600 Localized edema: Secondary | ICD-10-CM | POA: Diagnosis not present

## 2022-11-19 ENCOUNTER — Ambulatory Visit: Payer: Medicare Other | Admitting: Neurology

## 2022-12-11 DIAGNOSIS — R600 Localized edema: Secondary | ICD-10-CM | POA: Diagnosis not present

## 2022-12-11 DIAGNOSIS — G309 Alzheimer's disease, unspecified: Secondary | ICD-10-CM | POA: Diagnosis not present

## 2022-12-11 DIAGNOSIS — L03116 Cellulitis of left lower limb: Secondary | ICD-10-CM | POA: Diagnosis not present

## 2022-12-18 DIAGNOSIS — G4700 Insomnia, unspecified: Secondary | ICD-10-CM | POA: Diagnosis not present

## 2022-12-18 DIAGNOSIS — R600 Localized edema: Secondary | ICD-10-CM

## 2022-12-18 DIAGNOSIS — G309 Alzheimer's disease, unspecified: Secondary | ICD-10-CM | POA: Diagnosis not present

## 2022-12-18 DIAGNOSIS — E785 Hyperlipidemia, unspecified: Secondary | ICD-10-CM | POA: Diagnosis not present

## 2022-12-18 DIAGNOSIS — I119 Hypertensive heart disease without heart failure: Secondary | ICD-10-CM | POA: Diagnosis not present

## 2023-01-30 DIAGNOSIS — G309 Alzheimer's disease, unspecified: Secondary | ICD-10-CM

## 2023-01-30 DIAGNOSIS — R634 Abnormal weight loss: Secondary | ICD-10-CM

## 2023-02-09 DIAGNOSIS — S61411A Laceration without foreign body of right hand, initial encounter: Secondary | ICD-10-CM | POA: Diagnosis not present

## 2023-02-09 DIAGNOSIS — J439 Emphysema, unspecified: Secondary | ICD-10-CM | POA: Diagnosis not present

## 2023-02-09 DIAGNOSIS — S62326A Displaced fracture of shaft of fifth metacarpal bone, right hand, initial encounter for closed fracture: Secondary | ICD-10-CM | POA: Diagnosis not present

## 2023-02-09 DIAGNOSIS — M1811 Unilateral primary osteoarthritis of first carpometacarpal joint, right hand: Secondary | ICD-10-CM | POA: Diagnosis not present

## 2023-02-09 DIAGNOSIS — I6523 Occlusion and stenosis of bilateral carotid arteries: Secondary | ICD-10-CM | POA: Diagnosis not present

## 2023-02-09 DIAGNOSIS — I700 Atherosclerosis of aorta: Secondary | ICD-10-CM | POA: Diagnosis not present

## 2023-02-09 DIAGNOSIS — S60511A Abrasion of right hand, initial encounter: Secondary | ICD-10-CM | POA: Diagnosis not present

## 2023-02-11 DIAGNOSIS — G309 Alzheimer's disease, unspecified: Secondary | ICD-10-CM | POA: Diagnosis not present

## 2023-02-11 DIAGNOSIS — M6281 Muscle weakness (generalized): Secondary | ICD-10-CM | POA: Diagnosis not present

## 2023-02-11 DIAGNOSIS — S62316A Displaced fracture of base of fifth metacarpal bone, right hand, initial encounter for closed fracture: Secondary | ICD-10-CM

## 2023-02-11 DIAGNOSIS — S8002XA Contusion of left knee, initial encounter: Secondary | ICD-10-CM

## 2023-02-11 DIAGNOSIS — Z9181 History of falling: Secondary | ICD-10-CM

## 2023-02-11 DIAGNOSIS — S8001XA Contusion of right knee, initial encounter: Secondary | ICD-10-CM

## 2023-02-11 DIAGNOSIS — W19XXXA Unspecified fall, initial encounter: Secondary | ICD-10-CM

## 2023-02-11 DIAGNOSIS — R2689 Other abnormalities of gait and mobility: Secondary | ICD-10-CM | POA: Diagnosis not present

## 2023-02-11 DIAGNOSIS — S50811A Abrasion of right forearm, initial encounter: Secondary | ICD-10-CM

## 2023-02-11 DIAGNOSIS — R269 Unspecified abnormalities of gait and mobility: Secondary | ICD-10-CM | POA: Diagnosis not present

## 2023-02-18 DIAGNOSIS — S62336A Displaced fracture of neck of fifth metacarpal bone, right hand, initial encounter for closed fracture: Secondary | ICD-10-CM | POA: Diagnosis not present

## 2023-03-18 DIAGNOSIS — S62336A Displaced fracture of neck of fifth metacarpal bone, right hand, initial encounter for closed fracture: Secondary | ICD-10-CM | POA: Diagnosis not present

## 2023-04-02 DIAGNOSIS — R5381 Other malaise: Secondary | ICD-10-CM | POA: Diagnosis not present

## 2023-04-02 DIAGNOSIS — G309 Alzheimer's disease, unspecified: Secondary | ICD-10-CM | POA: Diagnosis not present

## 2023-04-02 DIAGNOSIS — Z87440 Personal history of urinary (tract) infections: Secondary | ICD-10-CM | POA: Diagnosis not present

## 2023-04-02 DIAGNOSIS — G4714 Hypersomnia due to medical condition: Secondary | ICD-10-CM | POA: Diagnosis not present

## 2023-04-07 DIAGNOSIS — S62336A Displaced fracture of neck of fifth metacarpal bone, right hand, initial encounter for closed fracture: Secondary | ICD-10-CM | POA: Diagnosis not present

## 2023-04-08 DIAGNOSIS — R600 Localized edema: Secondary | ICD-10-CM | POA: Diagnosis not present

## 2023-04-08 DIAGNOSIS — T148XXA Other injury of unspecified body region, initial encounter: Secondary | ICD-10-CM | POA: Diagnosis not present

## 2023-04-10 DIAGNOSIS — G309 Alzheimer's disease, unspecified: Secondary | ICD-10-CM | POA: Diagnosis not present

## 2023-04-10 DIAGNOSIS — Z7689 Persons encountering health services in other specified circumstances: Secondary | ICD-10-CM | POA: Diagnosis not present

## 2023-04-10 DIAGNOSIS — G894 Chronic pain syndrome: Secondary | ICD-10-CM | POA: Diagnosis not present

## 2023-04-15 DIAGNOSIS — E785 Hyperlipidemia, unspecified: Secondary | ICD-10-CM | POA: Diagnosis not present

## 2023-04-15 DIAGNOSIS — I2510 Atherosclerotic heart disease of native coronary artery without angina pectoris: Secondary | ICD-10-CM

## 2023-04-15 DIAGNOSIS — G4700 Insomnia, unspecified: Secondary | ICD-10-CM | POA: Diagnosis not present

## 2023-04-15 DIAGNOSIS — M6281 Muscle weakness (generalized): Secondary | ICD-10-CM

## 2023-04-15 DIAGNOSIS — G2581 Restless legs syndrome: Secondary | ICD-10-CM

## 2023-04-15 DIAGNOSIS — G309 Alzheimer's disease, unspecified: Secondary | ICD-10-CM | POA: Diagnosis not present

## 2023-04-15 DIAGNOSIS — I119 Hypertensive heart disease without heart failure: Secondary | ICD-10-CM | POA: Diagnosis not present

## 2023-04-16 DIAGNOSIS — L853 Xerosis cutis: Secondary | ICD-10-CM | POA: Diagnosis not present

## 2023-04-16 DIAGNOSIS — B351 Tinea unguium: Secondary | ICD-10-CM | POA: Diagnosis not present

## 2023-04-16 DIAGNOSIS — I7091 Generalized atherosclerosis: Secondary | ICD-10-CM | POA: Diagnosis not present

## 2023-06-10 DIAGNOSIS — G309 Alzheimer's disease, unspecified: Secondary | ICD-10-CM | POA: Diagnosis not present

## 2023-06-10 DIAGNOSIS — Z5181 Encounter for therapeutic drug level monitoring: Secondary | ICD-10-CM | POA: Diagnosis not present

## 2023-08-03 DIAGNOSIS — M6281 Muscle weakness (generalized): Secondary | ICD-10-CM | POA: Diagnosis not present

## 2023-08-03 DIAGNOSIS — G309 Alzheimer's disease, unspecified: Secondary | ICD-10-CM | POA: Diagnosis not present

## 2023-08-03 DIAGNOSIS — M25551 Pain in right hip: Secondary | ICD-10-CM | POA: Diagnosis not present

## 2023-08-12 DIAGNOSIS — G309 Alzheimer's disease, unspecified: Secondary | ICD-10-CM

## 2023-08-12 DIAGNOSIS — I119 Hypertensive heart disease without heart failure: Secondary | ICD-10-CM | POA: Diagnosis not present

## 2023-08-12 DIAGNOSIS — E559 Vitamin D deficiency, unspecified: Secondary | ICD-10-CM

## 2023-08-12 DIAGNOSIS — R600 Localized edema: Secondary | ICD-10-CM | POA: Diagnosis not present

## 2023-08-12 DIAGNOSIS — M6281 Muscle weakness (generalized): Secondary | ICD-10-CM

## 2023-08-12 DIAGNOSIS — E785 Hyperlipidemia, unspecified: Secondary | ICD-10-CM | POA: Diagnosis not present

## 2023-08-12 DIAGNOSIS — G4700 Insomnia, unspecified: Secondary | ICD-10-CM | POA: Diagnosis not present

## 2023-09-17 DIAGNOSIS — R059 Cough, unspecified: Secondary | ICD-10-CM

## 2023-09-17 DIAGNOSIS — G309 Alzheimer's disease, unspecified: Secondary | ICD-10-CM

## 2023-09-17 DIAGNOSIS — R0989 Other specified symptoms and signs involving the circulatory and respiratory systems: Secondary | ICD-10-CM

## 2023-09-17 DIAGNOSIS — J069 Acute upper respiratory infection, unspecified: Secondary | ICD-10-CM

## 2023-10-21 DIAGNOSIS — R059 Cough, unspecified: Secondary | ICD-10-CM

## 2023-10-21 DIAGNOSIS — G309 Alzheimer's disease, unspecified: Secondary | ICD-10-CM

## 2023-10-21 DIAGNOSIS — R0989 Other specified symptoms and signs involving the circulatory and respiratory systems: Secondary | ICD-10-CM

## 2023-10-28 DIAGNOSIS — R1310 Dysphagia, unspecified: Secondary | ICD-10-CM | POA: Diagnosis not present

## 2023-10-28 DIAGNOSIS — G309 Alzheimer's disease, unspecified: Secondary | ICD-10-CM | POA: Diagnosis not present

## 2023-10-28 DIAGNOSIS — R630 Anorexia: Secondary | ICD-10-CM | POA: Diagnosis not present

## 2023-10-28 DIAGNOSIS — R634 Abnormal weight loss: Secondary | ICD-10-CM | POA: Diagnosis not present

## 2023-10-29 DIAGNOSIS — G309 Alzheimer's disease, unspecified: Secondary | ICD-10-CM | POA: Diagnosis not present

## 2023-10-29 DIAGNOSIS — G4710 Hypersomnia, unspecified: Secondary | ICD-10-CM | POA: Diagnosis not present

## 2023-10-29 DIAGNOSIS — R0989 Other specified symptoms and signs involving the circulatory and respiratory systems: Secondary | ICD-10-CM | POA: Diagnosis not present

## 2023-10-29 DIAGNOSIS — R059 Cough, unspecified: Secondary | ICD-10-CM | POA: Diagnosis not present

## 2023-11-02 DIAGNOSIS — J069 Acute upper respiratory infection, unspecified: Secondary | ICD-10-CM | POA: Diagnosis not present

## 2023-11-02 DIAGNOSIS — R059 Cough, unspecified: Secondary | ICD-10-CM | POA: Diagnosis not present

## 2023-11-02 DIAGNOSIS — G309 Alzheimer's disease, unspecified: Secondary | ICD-10-CM | POA: Diagnosis not present

## 2023-11-02 DIAGNOSIS — J309 Allergic rhinitis, unspecified: Secondary | ICD-10-CM | POA: Diagnosis not present

## 2023-11-04 DIAGNOSIS — I10 Essential (primary) hypertension: Secondary | ICD-10-CM | POA: Diagnosis not present

## 2023-11-04 DIAGNOSIS — R600 Localized edema: Secondary | ICD-10-CM | POA: Diagnosis not present

## 2023-11-04 DIAGNOSIS — G309 Alzheimer's disease, unspecified: Secondary | ICD-10-CM

## 2023-11-04 DIAGNOSIS — I959 Hypotension, unspecified: Secondary | ICD-10-CM | POA: Diagnosis not present

## 2023-11-06 DIAGNOSIS — D62 Acute posthemorrhagic anemia: Secondary | ICD-10-CM | POA: Diagnosis not present

## 2023-11-06 DIAGNOSIS — G2581 Restless legs syndrome: Secondary | ICD-10-CM | POA: Diagnosis not present

## 2023-11-06 DIAGNOSIS — M80051D Age-related osteoporosis with current pathological fracture, right femur, subsequent encounter for fracture with routine healing: Secondary | ICD-10-CM | POA: Diagnosis not present

## 2023-11-06 DIAGNOSIS — W19XXXA Unspecified fall, initial encounter: Secondary | ICD-10-CM | POA: Diagnosis not present

## 2023-11-06 DIAGNOSIS — Z79899 Other long term (current) drug therapy: Secondary | ICD-10-CM | POA: Diagnosis not present

## 2023-11-06 DIAGNOSIS — S72011A Unspecified intracapsular fracture of right femur, initial encounter for closed fracture: Secondary | ICD-10-CM | POA: Diagnosis not present

## 2023-11-06 DIAGNOSIS — S72001A Fracture of unspecified part of neck of right femur, initial encounter for closed fracture: Secondary | ICD-10-CM | POA: Diagnosis not present

## 2023-11-06 DIAGNOSIS — R9431 Abnormal electrocardiogram [ECG] [EKG]: Secondary | ICD-10-CM | POA: Diagnosis not present

## 2023-11-06 DIAGNOSIS — I252 Old myocardial infarction: Secondary | ICD-10-CM | POA: Diagnosis not present

## 2023-11-06 DIAGNOSIS — R2681 Unsteadiness on feet: Secondary | ICD-10-CM | POA: Diagnosis not present

## 2023-11-06 DIAGNOSIS — Z01818 Encounter for other preprocedural examination: Secondary | ICD-10-CM | POA: Diagnosis not present

## 2023-11-06 DIAGNOSIS — W19XXXD Unspecified fall, subsequent encounter: Secondary | ICD-10-CM | POA: Diagnosis not present

## 2023-11-06 DIAGNOSIS — Z7401 Bed confinement status: Secondary | ICD-10-CM | POA: Diagnosis not present

## 2023-11-06 DIAGNOSIS — E785 Hyperlipidemia, unspecified: Secondary | ICD-10-CM | POA: Diagnosis not present

## 2023-11-06 DIAGNOSIS — D649 Anemia, unspecified: Secondary | ICD-10-CM | POA: Diagnosis not present

## 2023-11-06 DIAGNOSIS — Z7982 Long term (current) use of aspirin: Secondary | ICD-10-CM | POA: Diagnosis not present

## 2023-11-06 DIAGNOSIS — R404 Transient alteration of awareness: Secondary | ICD-10-CM | POA: Diagnosis not present

## 2023-11-06 DIAGNOSIS — Z66 Do not resuscitate: Secondary | ICD-10-CM | POA: Diagnosis not present

## 2023-11-06 DIAGNOSIS — E440 Moderate protein-calorie malnutrition: Secondary | ICD-10-CM | POA: Diagnosis not present

## 2023-11-06 DIAGNOSIS — E7800 Pure hypercholesterolemia, unspecified: Secondary | ICD-10-CM | POA: Diagnosis not present

## 2023-11-06 DIAGNOSIS — R2689 Other abnormalities of gait and mobility: Secondary | ICD-10-CM | POA: Diagnosis not present

## 2023-11-06 DIAGNOSIS — S72011D Unspecified intracapsular fracture of right femur, subsequent encounter for closed fracture with routine healing: Secondary | ICD-10-CM | POA: Diagnosis not present

## 2023-11-06 DIAGNOSIS — I10 Essential (primary) hypertension: Secondary | ICD-10-CM | POA: Diagnosis not present

## 2023-11-06 DIAGNOSIS — S72009A Fracture of unspecified part of neck of unspecified femur, initial encounter for closed fracture: Secondary | ICD-10-CM | POA: Diagnosis not present

## 2023-11-11 DIAGNOSIS — S72011D Unspecified intracapsular fracture of right femur, subsequent encounter for closed fracture with routine healing: Secondary | ICD-10-CM | POA: Diagnosis not present

## 2023-11-11 DIAGNOSIS — S72001D Fracture of unspecified part of neck of right femur, subsequent encounter for closed fracture with routine healing: Secondary | ICD-10-CM | POA: Diagnosis not present

## 2023-11-11 DIAGNOSIS — D62 Acute posthemorrhagic anemia: Secondary | ICD-10-CM | POA: Diagnosis not present

## 2023-11-11 DIAGNOSIS — G2581 Restless legs syndrome: Secondary | ICD-10-CM | POA: Diagnosis not present

## 2023-11-11 DIAGNOSIS — I10 Essential (primary) hypertension: Secondary | ICD-10-CM | POA: Diagnosis not present

## 2023-11-11 DIAGNOSIS — Z7401 Bed confinement status: Secondary | ICD-10-CM | POA: Diagnosis not present

## 2023-11-11 DIAGNOSIS — R2689 Other abnormalities of gait and mobility: Secondary | ICD-10-CM | POA: Diagnosis not present

## 2023-11-11 DIAGNOSIS — E785 Hyperlipidemia, unspecified: Secondary | ICD-10-CM | POA: Diagnosis not present

## 2023-11-11 DIAGNOSIS — S72011A Unspecified intracapsular fracture of right femur, initial encounter for closed fracture: Secondary | ICD-10-CM | POA: Diagnosis not present

## 2023-11-11 DIAGNOSIS — M80051D Age-related osteoporosis with current pathological fracture, right femur, subsequent encounter for fracture with routine healing: Secondary | ICD-10-CM | POA: Diagnosis not present

## 2023-11-11 DIAGNOSIS — R262 Difficulty in walking, not elsewhere classified: Secondary | ICD-10-CM | POA: Diagnosis not present

## 2023-11-11 DIAGNOSIS — E440 Moderate protein-calorie malnutrition: Secondary | ICD-10-CM | POA: Diagnosis not present

## 2023-11-11 DIAGNOSIS — W19XXXD Unspecified fall, subsequent encounter: Secondary | ICD-10-CM | POA: Diagnosis not present

## 2023-11-11 DIAGNOSIS — R2681 Unsteadiness on feet: Secondary | ICD-10-CM | POA: Diagnosis not present

## 2023-11-11 DIAGNOSIS — D649 Anemia, unspecified: Secondary | ICD-10-CM | POA: Diagnosis not present

## 2023-11-12 DIAGNOSIS — D649 Anemia, unspecified: Secondary | ICD-10-CM | POA: Diagnosis not present

## 2023-11-12 DIAGNOSIS — S72001D Fracture of unspecified part of neck of right femur, subsequent encounter for closed fracture with routine healing: Secondary | ICD-10-CM | POA: Diagnosis not present

## 2023-11-12 DIAGNOSIS — I10 Essential (primary) hypertension: Secondary | ICD-10-CM | POA: Diagnosis not present

## 2023-11-12 DIAGNOSIS — Z7401 Bed confinement status: Secondary | ICD-10-CM | POA: Diagnosis not present

## 2023-11-12 DIAGNOSIS — R262 Difficulty in walking, not elsewhere classified: Secondary | ICD-10-CM | POA: Diagnosis not present

## 2023-12-17 DIAGNOSIS — S72001A Fracture of unspecified part of neck of right femur, initial encounter for closed fracture: Secondary | ICD-10-CM | POA: Diagnosis not present

## 2023-12-17 DIAGNOSIS — Z96641 Presence of right artificial hip joint: Secondary | ICD-10-CM | POA: Diagnosis not present

## 2024-05-04 DIAGNOSIS — D649 Anemia, unspecified: Secondary | ICD-10-CM | POA: Diagnosis not present

## 2024-05-04 DIAGNOSIS — E785 Hyperlipidemia, unspecified: Secondary | ICD-10-CM | POA: Diagnosis not present

## 2024-05-04 DIAGNOSIS — E559 Vitamin D deficiency, unspecified: Secondary | ICD-10-CM | POA: Diagnosis not present

## 2024-05-04 DIAGNOSIS — I10 Essential (primary) hypertension: Secondary | ICD-10-CM | POA: Diagnosis not present

## 2024-07-04 DEATH — deceased

## 2024-07-25 ENCOUNTER — Ambulatory Visit: Admitting: Gastroenterology
